# Patient Record
Sex: Female | Born: 2000 | Race: White | Hispanic: No | Smoking: Former smoker
Health system: Southern US, Community
[De-identification: ages and names within clinical notes are randomized; demographics above are authoritative.]

## PROBLEM LIST (undated history)

## (undated) ENCOUNTER — Emergency Department: Payer: Medicaid Other | Source: Home / Self Care

## (undated) DIAGNOSIS — J351 Hypertrophy of tonsils: Secondary | ICD-10-CM

## (undated) DIAGNOSIS — G4730 Sleep apnea, unspecified: Secondary | ICD-10-CM

## (undated) DIAGNOSIS — J45909 Unspecified asthma, uncomplicated: Secondary | ICD-10-CM

## (undated) HISTORY — PX: GUM SURGERY: SHX658

---
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
## 2021-01-02 LAB — OB RESULTS CONSOLE RUBELLA ANTIBODY, IGM: Rubella: IMMUNE

## 2021-01-02 LAB — OB RESULTS CONSOLE HEPATITIS B SURFACE ANTIGEN: Hepatitis B Surface Ag: NEGATIVE

## 2021-01-02 LAB — OB RESULTS CONSOLE VARICELLA ZOSTER ANTIBODY, IGG: Varicella: IMMUNE

## 2021-01-20 NOTE — L&D Delivery Note (Signed)
Delivery Note  Date of delivery: 07/12/2021 Estimated Date of Delivery: 07/09/21 Patient's last menstrual period was 10/03/2020 (approximate). EGA: [redacted]w[redacted]d  Delivery Note At 2:26 AM a viable female was delivered via Vaginal, Spontaneous (Presentation: LOA).  APGAR: 8, 9; weight  pending.  Placenta status: Spontaneous, Intact.  Cord: 3 vessels with the following complications: None.  Cord pH: n/a  First Stage: Labor onset: unknown Augmentation : Cytotec and Pitocin Analgesia /Anesthesia intrapartum: Epidural SROM at 2045  Tammie Meyer presented to L&D for an IOL for postdates. She was induced with cytotec and pitocin. Epidural placed for pain relief.   Second Stage: Complete dilation at 0126 Onset of pushing at 0155 FHR second stage Cat I / Cat II Delivery at 0226 on 07/12/2021  She progressed to complete and had a spontaneous vaginal birth of a live female over an intact perineum. The fetal head was delivered in OA position with restitution to LOA. No nuchal cord. Anterior then posterior shoulders delivered spontaneously. Baby placed on mom's abdomen and attended to by transition RN. Cord clamped and cut when pulseless by FOB.   Third Stage: Placenta delivered intact with 3VC at 0226 Placenta disposition: routine disposal Uterine tone firm / bleeding min IV pitocin given for hemorrhage prophylaxis  Anesthesia: Epidural Episiotomy: None Lacerations: 1st degree;Vaginal Suture Repair: 3.0 vicryl Est. Blood Loss (mL):  300  Complications: none  Mom to postpartum.  Baby to Couplet care / Skin to Skin.  Newborn: Birth Weight: pending  Apgar Scores: 8, 9 Feeding planned: Breastfeeding   Cyril Mourning, CNM 07/12/2021 2:50 AM

## 2021-02-19 ENCOUNTER — Observation Stay: Payer: Medicaid Other

## 2021-02-19 ENCOUNTER — Other Ambulatory Visit: Payer: Self-pay

## 2021-02-19 ENCOUNTER — Observation Stay: Payer: Medicaid Other | Admitting: Anesthesiology

## 2021-02-19 ENCOUNTER — Observation Stay
Admission: EM | Admit: 2021-02-19 | Discharge: 2021-02-20 | Disposition: A | Discharge status: Home / Self Care | Payer: Medicaid Other | Attending: Obstetrics and Gynecology | Admitting: Obstetrics and Gynecology

## 2021-02-19 ENCOUNTER — Encounter: Admission: EM | Disposition: A | Discharge status: Home / Self Care | Payer: Self-pay | Source: Home / Self Care

## 2021-02-19 ENCOUNTER — Encounter: Payer: Self-pay | Admitting: *Deleted

## 2021-02-19 DIAGNOSIS — F17210 Nicotine dependence, cigarettes, uncomplicated: Secondary | ICD-10-CM | POA: Insufficient documentation

## 2021-02-19 DIAGNOSIS — D270 Benign neoplasm of right ovary: Secondary | ICD-10-CM | POA: Diagnosis not present

## 2021-02-19 DIAGNOSIS — O3482 Maternal care for other abnormalities of pelvic organs, second trimester: Secondary | ICD-10-CM | POA: Diagnosis present

## 2021-02-19 DIAGNOSIS — R109 Unspecified abdominal pain: Secondary | ICD-10-CM

## 2021-02-19 DIAGNOSIS — O26892 Other specified pregnancy related conditions, second trimester: Secondary | ICD-10-CM

## 2021-02-19 DIAGNOSIS — O0992 Supervision of high risk pregnancy, unspecified, second trimester: Secondary | ICD-10-CM

## 2021-02-19 DIAGNOSIS — Z20822 Contact with and (suspected) exposure to covid-19: Secondary | ICD-10-CM | POA: Diagnosis not present

## 2021-02-19 DIAGNOSIS — O99332 Smoking (tobacco) complicating pregnancy, second trimester: Secondary | ICD-10-CM | POA: Diagnosis not present

## 2021-02-19 DIAGNOSIS — R1031 Right lower quadrant pain: Secondary | ICD-10-CM

## 2021-02-19 DIAGNOSIS — Z3A20 20 weeks gestation of pregnancy: Secondary | ICD-10-CM | POA: Diagnosis not present

## 2021-02-19 HISTORY — PX: LAPAROSCOPIC OVARIAN CYSTECTOMY: SHX6248

## 2021-02-19 LAB — CBC
HCT: 32.5 % — ABNORMAL LOW (ref 36.0–46.0)
Hemoglobin: 11.6 g/dL — ABNORMAL LOW (ref 12.0–15.0)
MCH: 31.6 pg (ref 26.0–34.0)
MCHC: 35.7 g/dL (ref 30.0–36.0)
MCV: 88.6 fL (ref 80.0–100.0)
Platelets: 229 10*3/uL (ref 150–400)
RBC: 3.67 MIL/uL — ABNORMAL LOW (ref 3.87–5.11)
RDW: 12.8 % (ref 11.5–15.5)
WBC: 11.3 10*3/uL — ABNORMAL HIGH (ref 4.0–10.5)
nRBC: 0 % (ref 0.0–0.2)

## 2021-02-19 LAB — URINALYSIS, ROUTINE W REFLEX MICROSCOPIC
Glucose, UA: NEGATIVE mg/dL
Hgb urine dipstick: NEGATIVE
Ketones, ur: NEGATIVE mg/dL
Leukocytes,Ua: NEGATIVE
Nitrite: NEGATIVE
Protein, ur: 30 mg/dL — AB
Specific Gravity, Urine: >1.03 — ABNORMAL HIGH (ref 1.005–1.030)
pH: 5.5 (ref 5.0–8.0)

## 2021-02-19 LAB — COMPREHENSIVE METABOLIC PANEL
ALT: 14 U/L (ref 0–44)
AST: 14 U/L — ABNORMAL LOW (ref 15–41)
Albumin: 3.1 g/dL — ABNORMAL LOW (ref 3.5–5.0)
Alkaline Phosphatase: 67 U/L (ref 38–126)
Anion gap: 7 (ref 5–15)
BUN: 7 mg/dL (ref 6–20)
CO2: 20 mmol/L — ABNORMAL LOW (ref 22–32)
Calcium: 8.6 mg/dL — ABNORMAL LOW (ref 8.9–10.3)
Chloride: 108 mmol/L (ref 98–111)
Creatinine, Ser: 0.49 mg/dL (ref 0.44–1.00)
GFR, Estimated: >60 mL/min (ref >60)
Glucose, Bld: 99 mg/dL (ref 70–99)
Potassium: 3.5 mmol/L (ref 3.5–5.1)
Sodium: 135 mmol/L (ref 135–145)
Total Bilirubin: 0.3 mg/dL (ref 0.3–1.2)
Total Protein: 6.5 g/dL (ref 6.5–8.1)

## 2021-02-19 LAB — RESP PANEL BY RT-PCR (FLU A&B, COVID) ARPGX2
Influenza A by PCR: NEGATIVE
Influenza B by PCR: NEGATIVE
SARS Coronavirus 2 by RT PCR: NEGATIVE

## 2021-02-19 LAB — URINALYSIS, MICROSCOPIC (REFLEX)
Bacteria, UA: NONE SEEN
RBC / HPF: NONE SEEN RBC/hpf (ref 0–5)

## 2021-02-19 LAB — PROTEIN / CREATININE RATIO, URINE
Creatinine, Urine: 389 mg/dL
Protein Creatinine Ratio: 0.06 mg/mg{Cre} (ref 0.00–0.15)
Total Protein, Urine: 25 mg/dL

## 2021-02-19 LAB — URINE DRUG SCREEN, QUALITATIVE (ARMC ONLY)
Amphetamines, Ur Screen: NOT DETECTED
Barbiturates, Ur Screen: NOT DETECTED
Benzodiazepine, Ur Scrn: NOT DETECTED
Cannabinoid 50 Ng, Ur ~~LOC~~: NOT DETECTED
Cocaine Metabolite,Ur ~~LOC~~: NOT DETECTED
MDMA (Ecstasy)Ur Screen: NOT DETECTED
Methadone Scn, Ur: NOT DETECTED
Opiate, Ur Screen: POSITIVE — AB
Phencyclidine (PCP) Ur S: NOT DETECTED
Tricyclic, Ur Screen: NOT DETECTED

## 2021-02-19 LAB — URIC ACID: Uric Acid, Serum: 3.4 mg/dL (ref 2.5–7.1)

## 2021-02-19 IMAGING — US US ART/VEN ABD/PELV/SCROTUM DOPPLER COMPLETE
1 series · 14 of 24 positions shown · non-contrast
Comparison: None.

CLINICAL DATA: Twenty weeks pregnant.  Right lower quadrant pain

EXAM:
DOPPLER ULTRASOUND OF OVARIES
TECHNIQUE: Color and duplex Doppler ultrasound was utilized to evaluate blood
flow to the right ovary.

[Series 1: us abdominal pelvic art/vent flow doppler · 24 acquisitions, 14 frames shown]
[im 1/24]
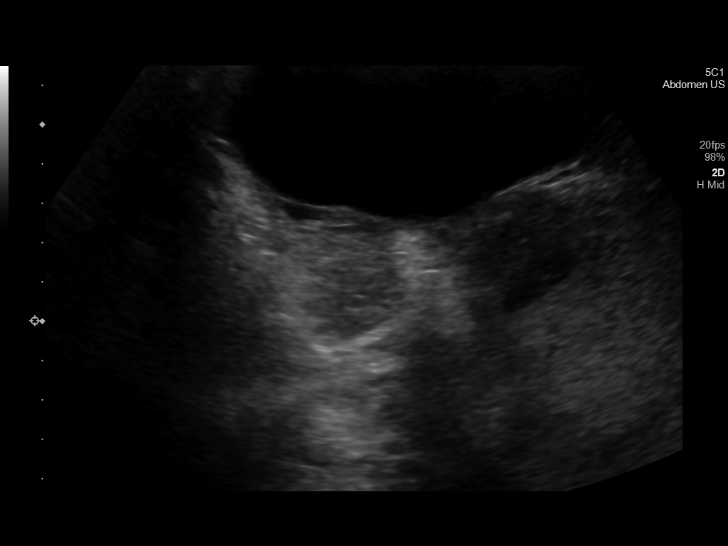
[im 3/24]
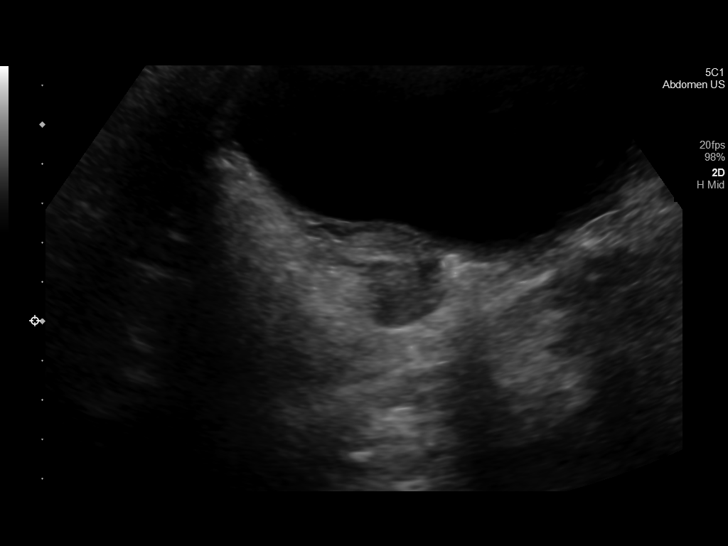
[im 5/24]
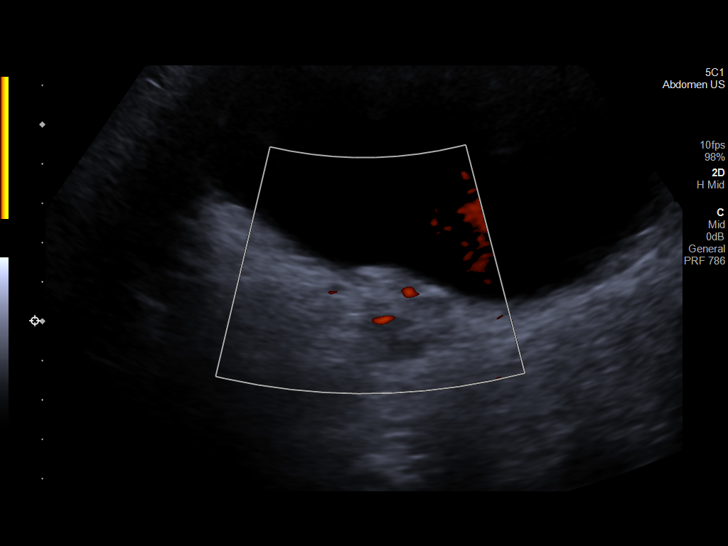
[im 7/24]
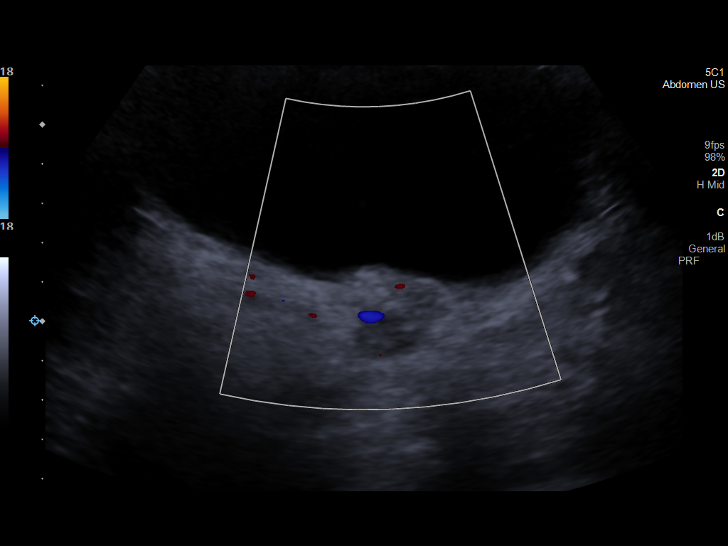
[im 8/24]
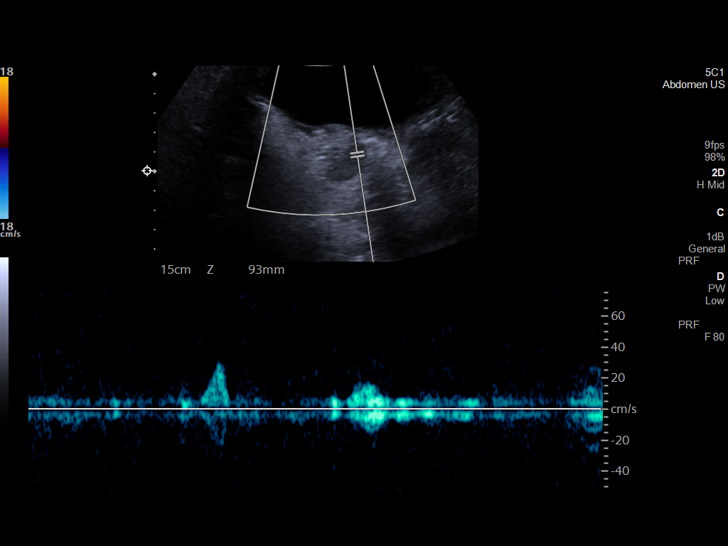
[im 10/24]
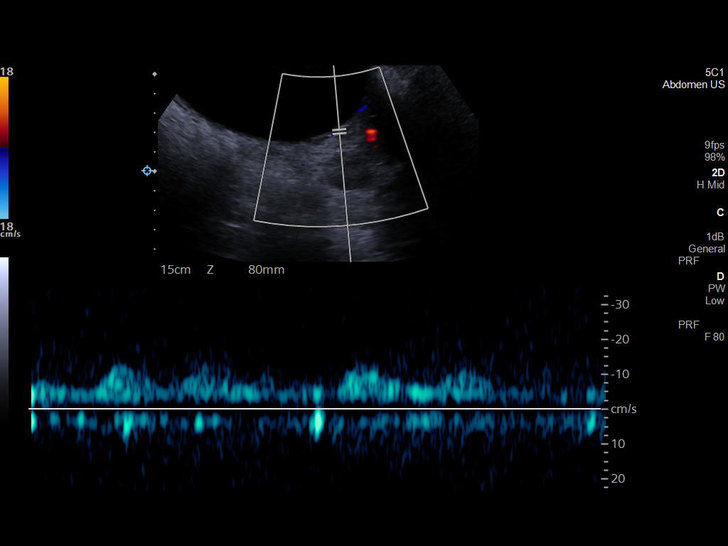
[im 12/24]
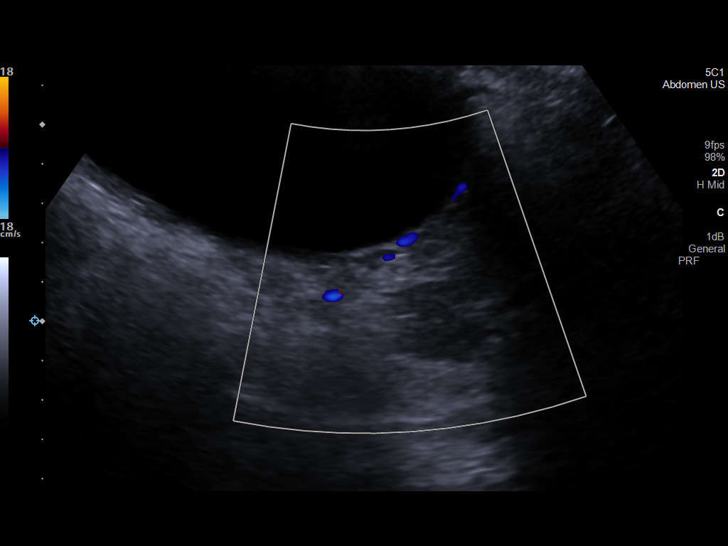
[im 13/24]
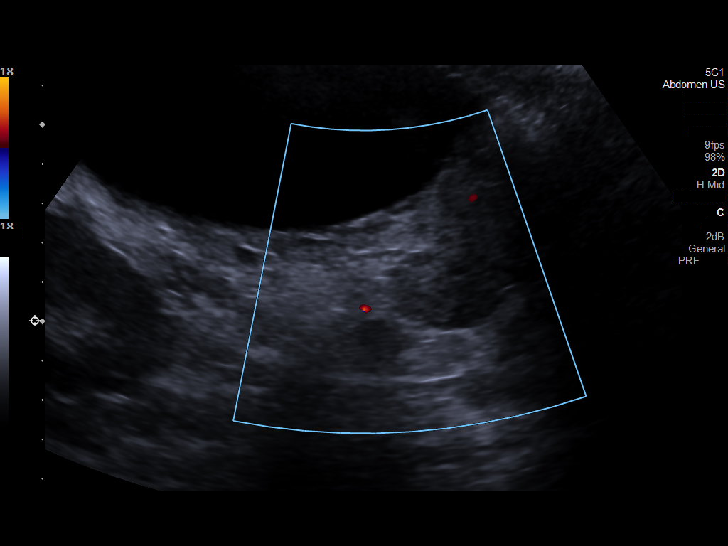
[im 15/24]
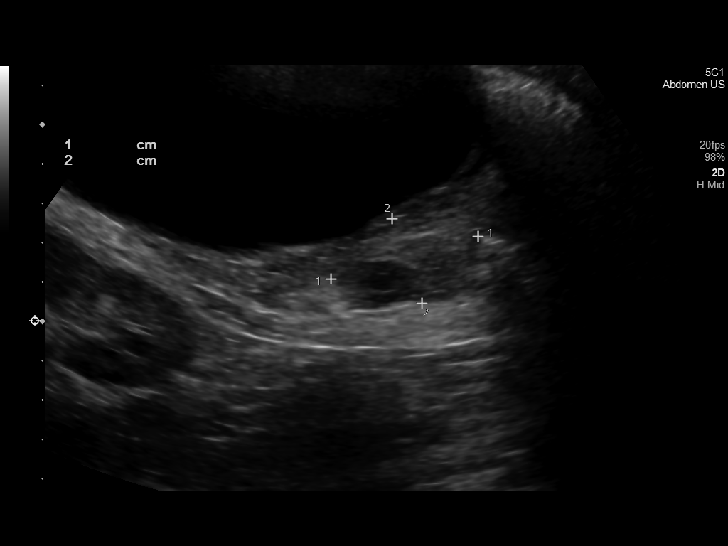
[im 17/24]
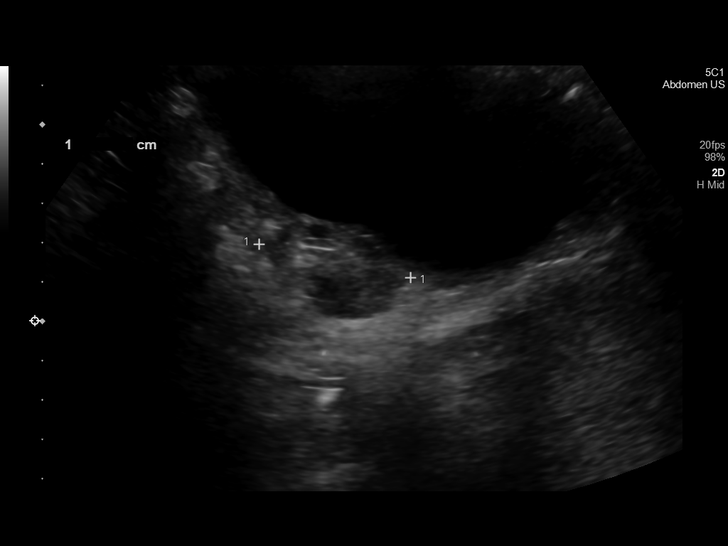
[im 19/24]
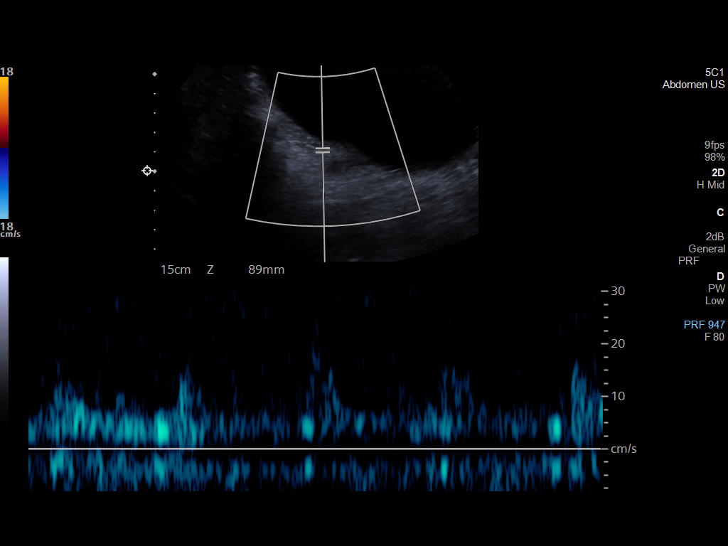
[im 20/24]
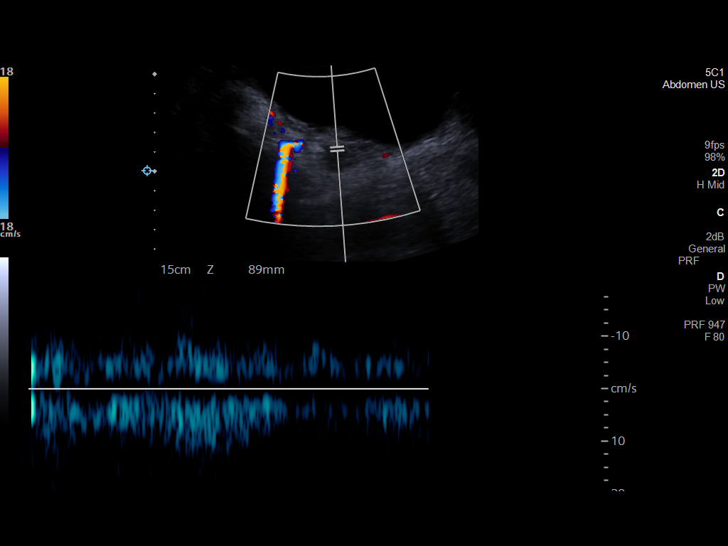
[im 22/24]
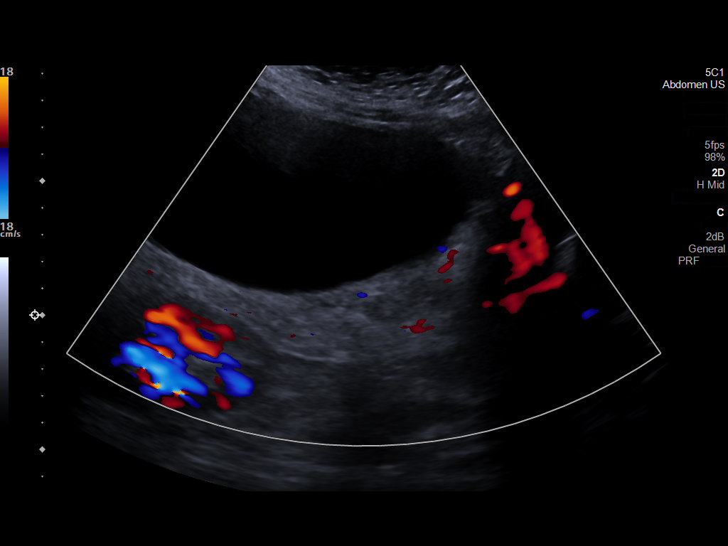
[im 24/24]
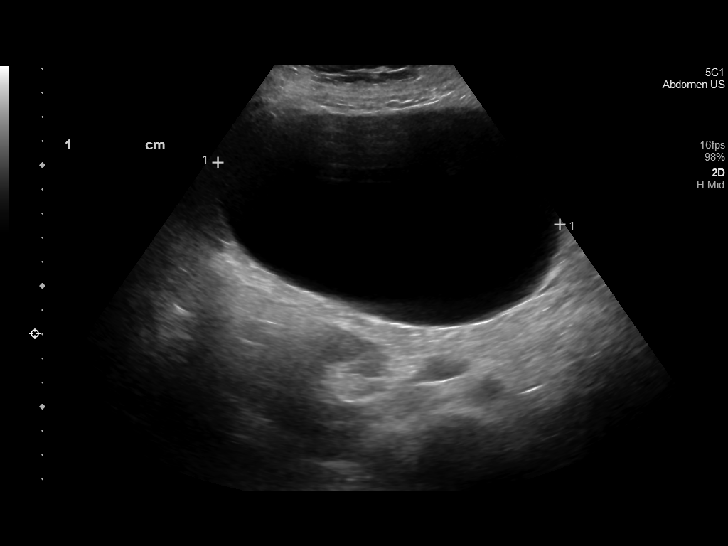

[14 of 24 positions shown; findings below may reference images not displayed]

FINDINGS: Right ovary

Measurements: 3.9 x 2.3 x 3.9 cm = volume: 18.3 mL. 13 x 5 x 9.9 x
14.4 cm anechoic right ovarian mass. Normal arterial and venous
Doppler waveforms are demonstrated in the right ovary.
IMPRESSION: 1. No right ovarian torsion.
2. A 13.5 x 9.9 x 14.4 cm right ovarian cyst. If there is further
clinical concern regarding right lower quadrant pain for the right
ovarian cyst, recommend a MRI of the abdomen and pelvis without
contrast.

## 2021-02-19 IMAGING — US US RENAL
1 series · 14 of 25 positions shown · non-contrast
Comparison: None.

CLINICAL DATA: Acute right flank and groin pain. Proximally 20
weeks gestational age.

EXAM:
RENAL / URINARY TRACT ULTRASOUND COMPLETE

[Series 1: us renal · 36 acquisitions, 14 frames shown]
[im 1/36]
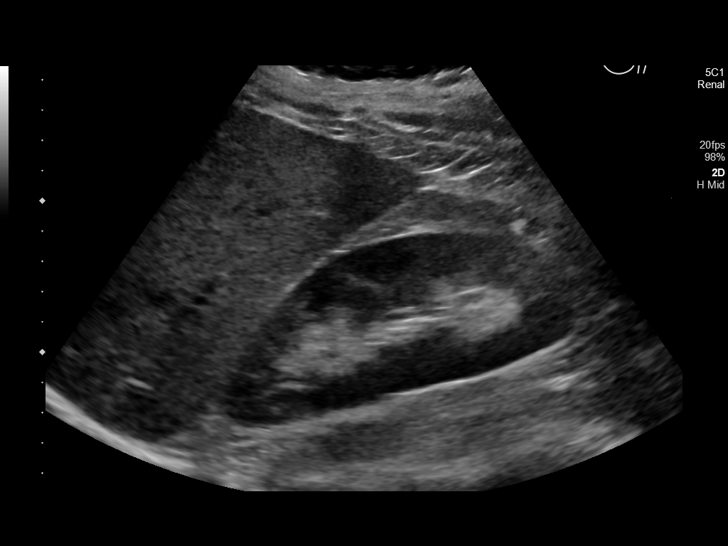
[im 3/36]
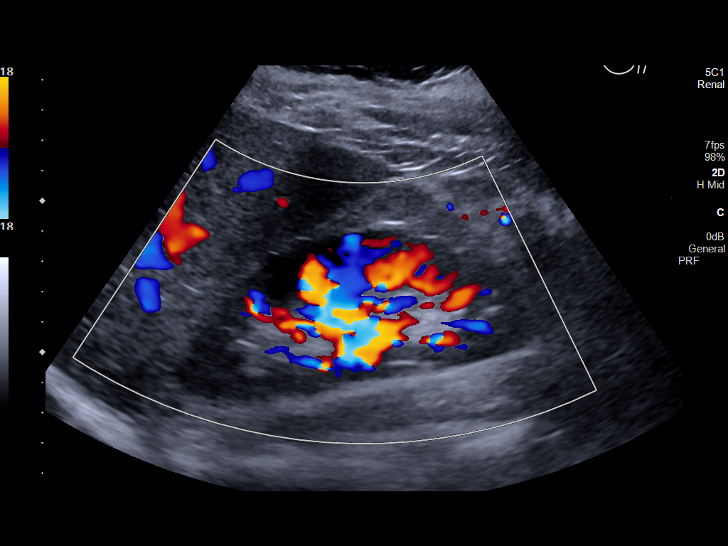
[im 6/36]
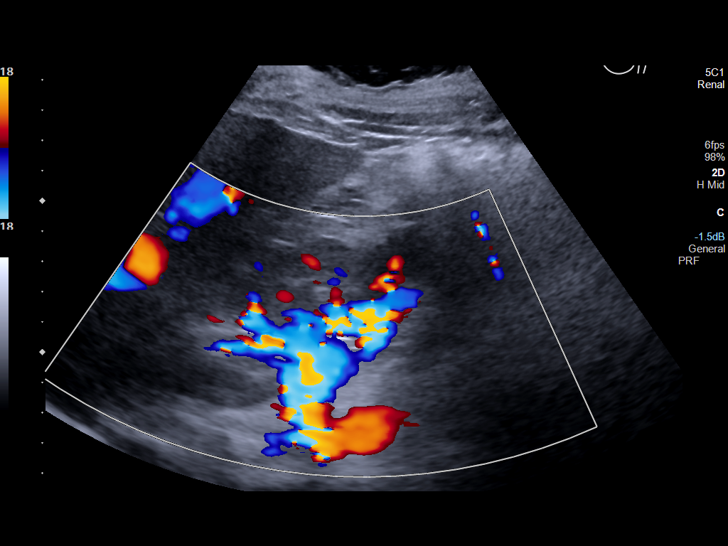
[im 9/36]
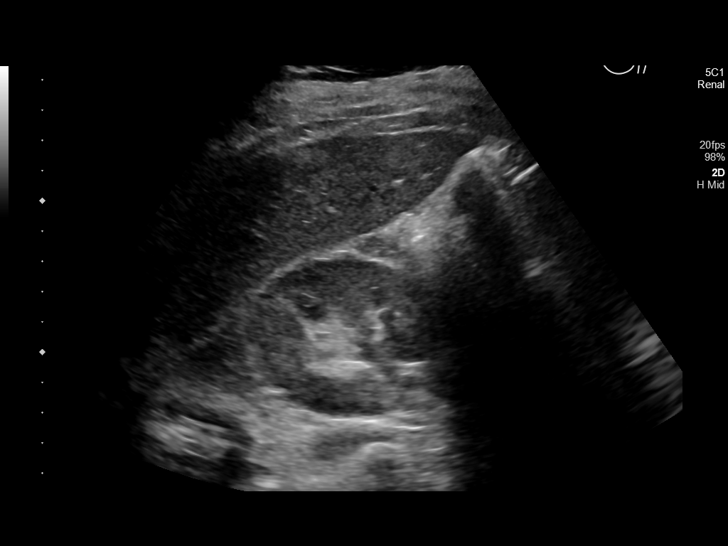
[im 12/36]
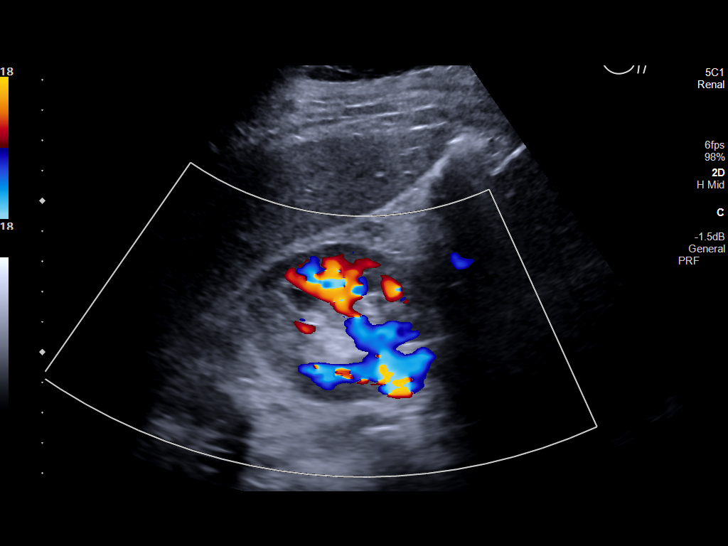
[im 14/36]
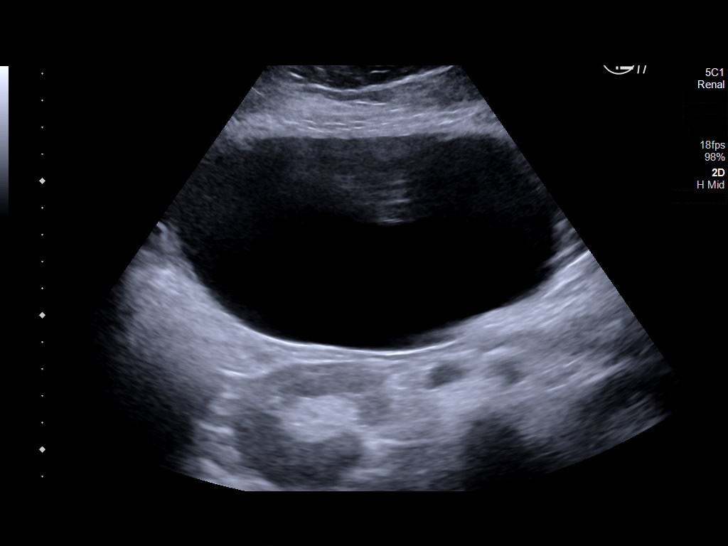
[im 17/36]
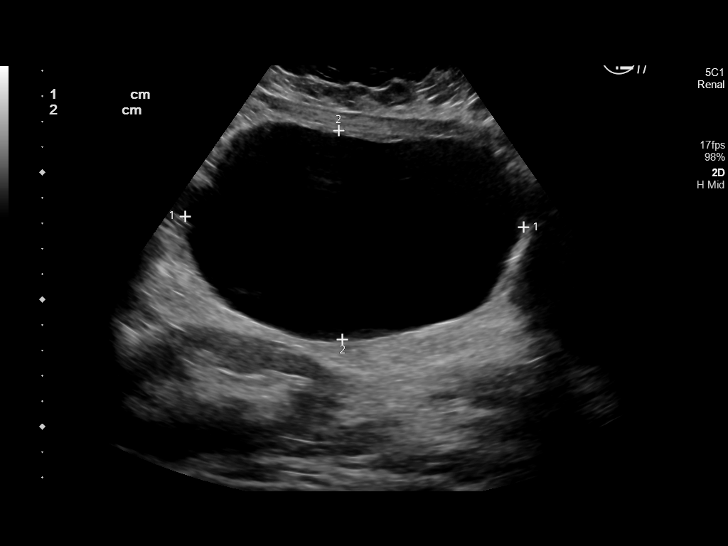
[im 19/36]
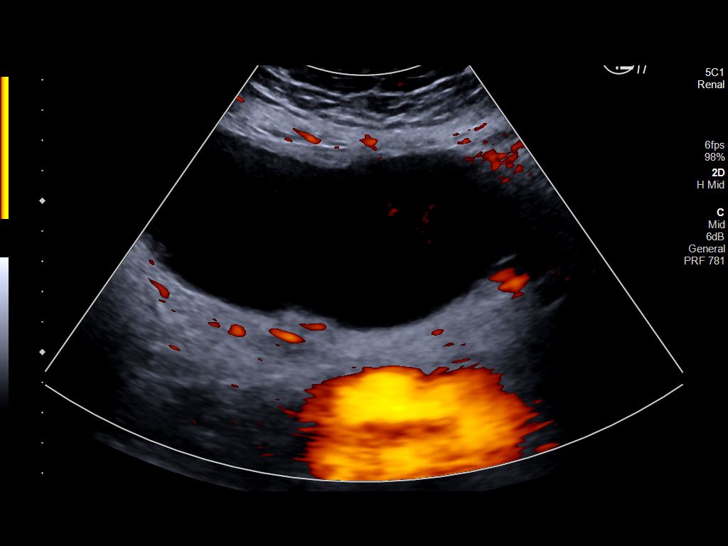
[im 22/36]
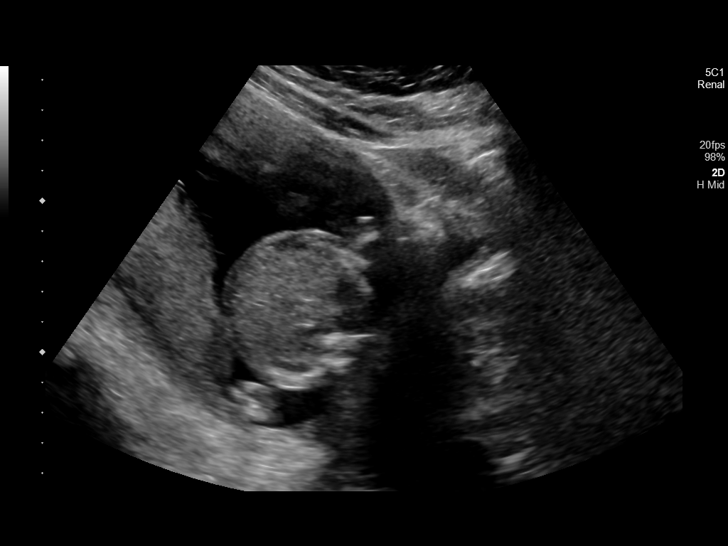
[im 24/36]
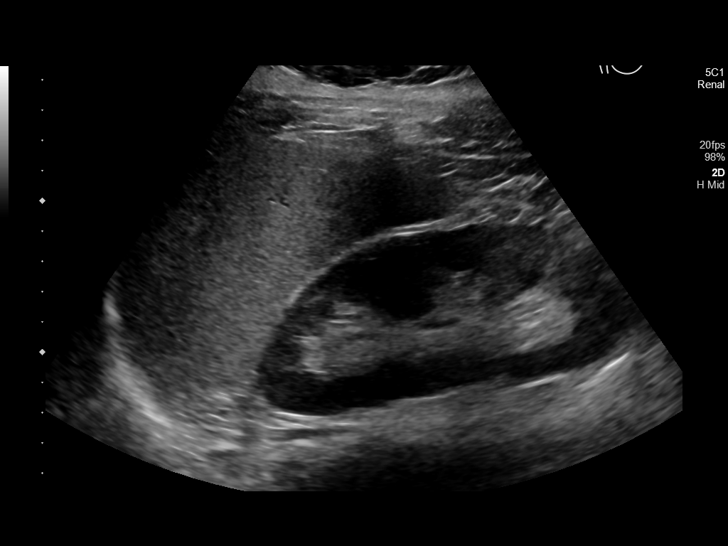
[im 27/36]
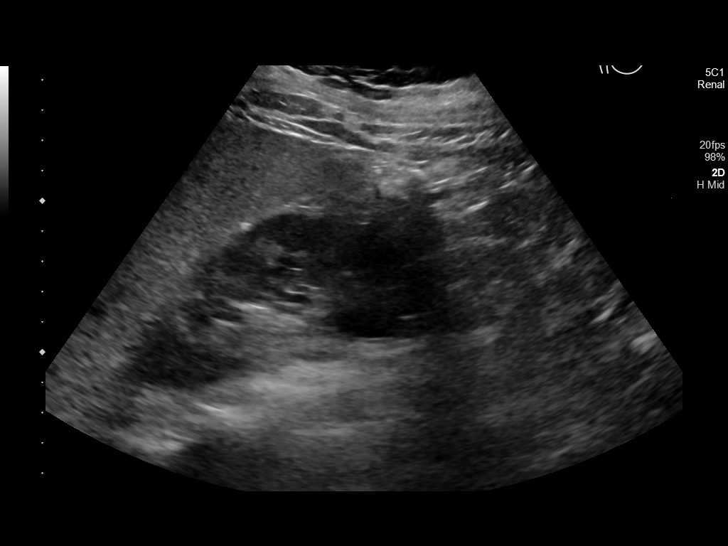
[im 30/36]
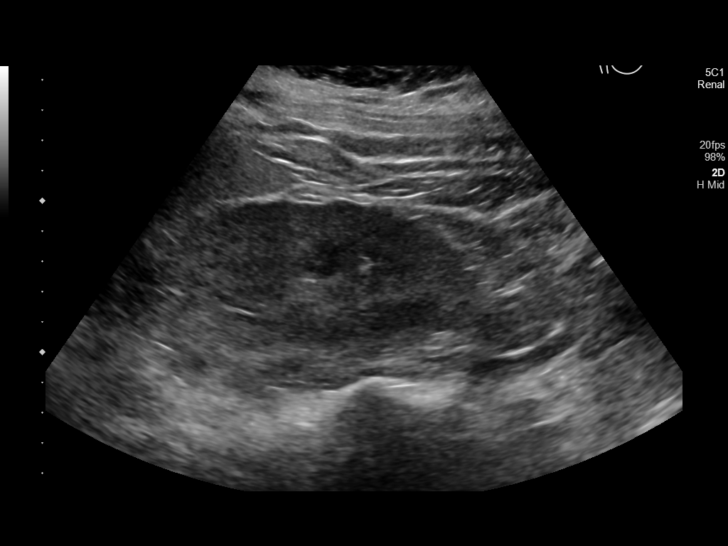
[im 33/36]
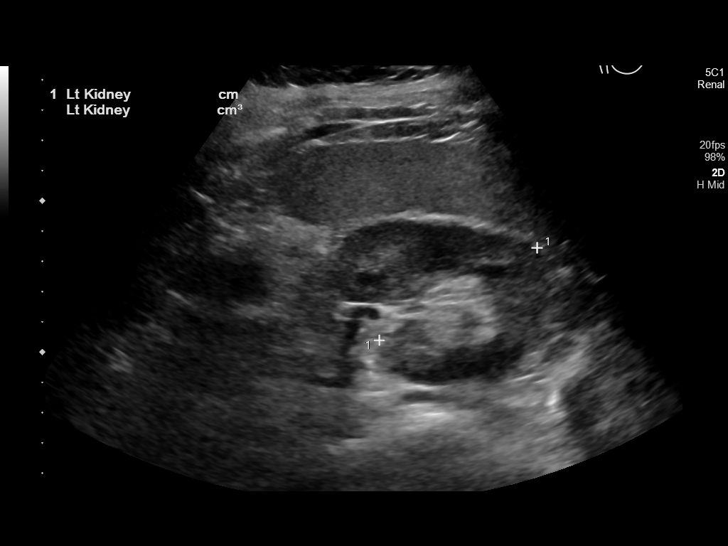
[im 36/36]
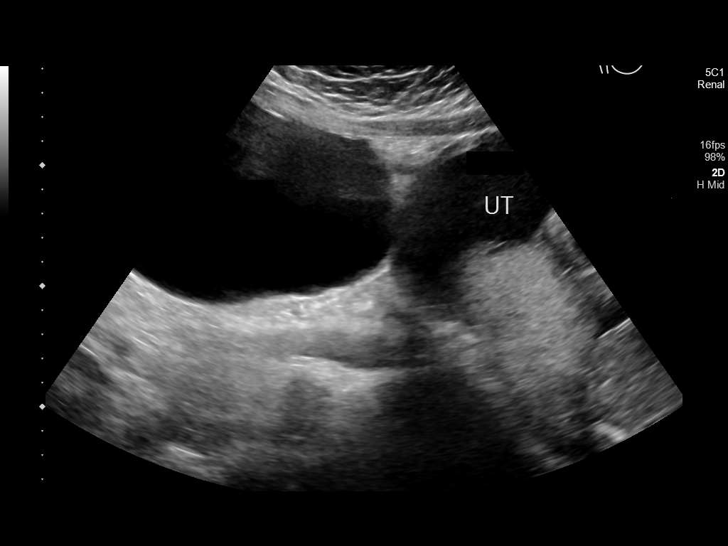

[14 of 25 positions shown; findings below may reference images not displayed]

FINDINGS: Right Kidney:

Renal measurements: 12.7 x 5.0 x 5.7 cm = volume: 187 mL.
Echogenicity within normal limits. No mass or hydronephrosis
visualized.

Left Kidney:

Renal measurements: 12.6 x 5.1 x 6.0 cm = volume: 203 mL.
Echogenicity within normal limits. No mass or hydronephrosis
visualized.

Bladder:

Nondistended.

Other:

13.3 x 8.2 x 15.3 cm simple appearing cyst is identified in the low
abdomen and upper pelvis in a right paramidline location superior to
the uterus.
IMPRESSION: 1. No evidence for hydronephrosis in either kidney.
2. Large simple appearing cyst in the midline/right paramidline
upper pelvis/lower abdomen measuring 13.3 x 8.2 x 15.3 cm.
[DATE] clinical note from [REDACTED] on [REDACTED] [REDACTED] documented a "Left (midline) simple ovarian cyst=14.50 x
8.42 x 15.21cm".
3. No evidence for intraperitoneal free fluid.

## 2021-02-19 IMAGING — MR MR PELVIS W/O CM
8 of 11 series · 32 of 48 positions shown · non-contrast
Comparison: Pelvic ultrasound [DATE]

CLINICAL DATA: Right lower quadrant pain with pregnancy

EXAM:
MRI ABDOMEN AND PELVIS WITHOUT CONTRAST
TECHNIQUE: Multiplanar multisequence MR imaging of the abdomen and pelvis was
performed. No intravenous contrast was administered.

[Series 4: cor haste · coronal · 5.0mm · 1.25mm/px · 3 of 44 slices shown]
[im 1/44]
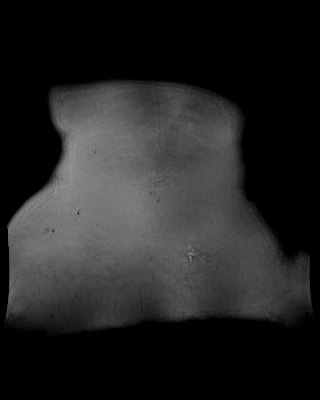
[im 22/44]
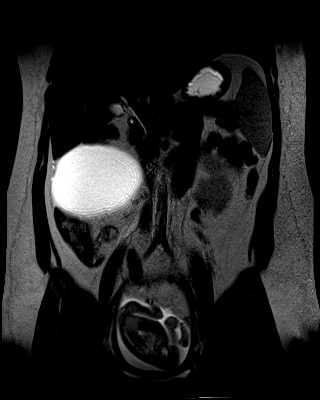
[im 44/44]
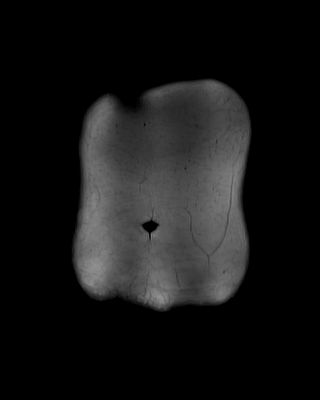

[Series 5: T2 · axial · 4.0mm · 1.00mm/px · z∈[-219,+51]mm · 3 of 55 slices shown (1 of 2)]
[im 1/55]
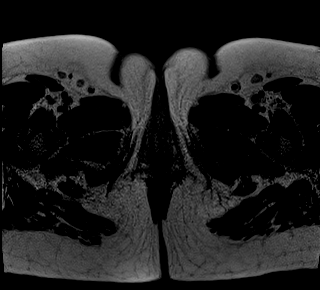
[im 28/55]
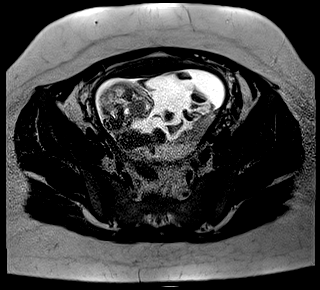
[im 55/55]
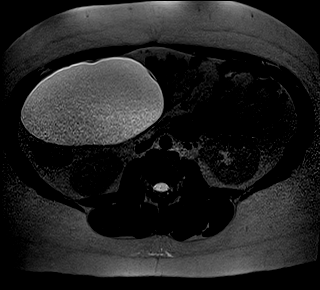

[Series 6: T2 fat-sat · axial · 4.0mm · 1.00mm/px · z∈[-219,+51]mm · 4 of 55 slices shown]
[im 1/55]
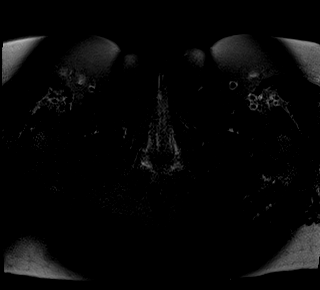
[im 19/55]
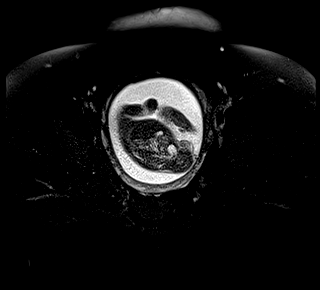
[im 37/55]
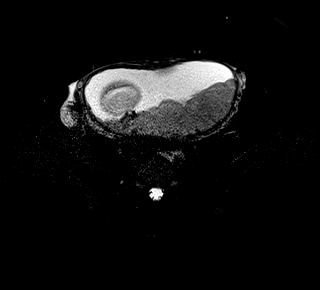
[im 55/55]
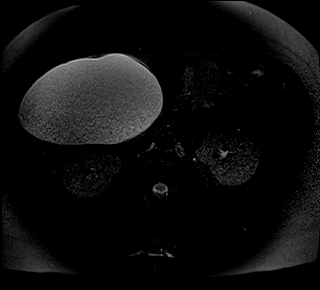

[Series 7: bSSFP · axial · 4.0mm · 0.62mm/px · z∈[-219,+51]mm · 4 of 55 slices shown (1 of 2)]
[im 1/55]
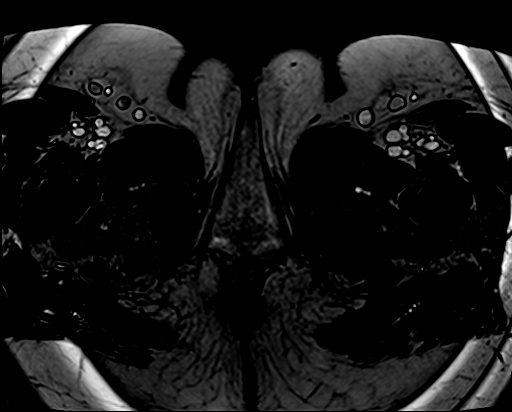
[im 19/55]
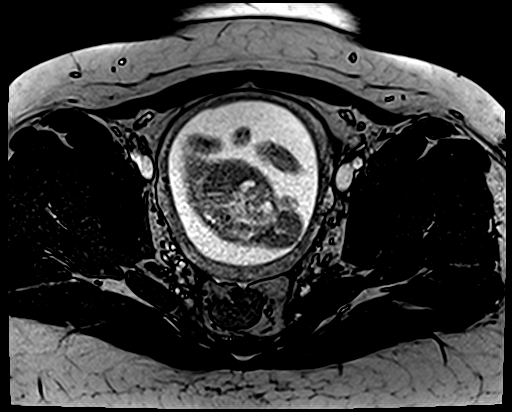
[im 37/55]
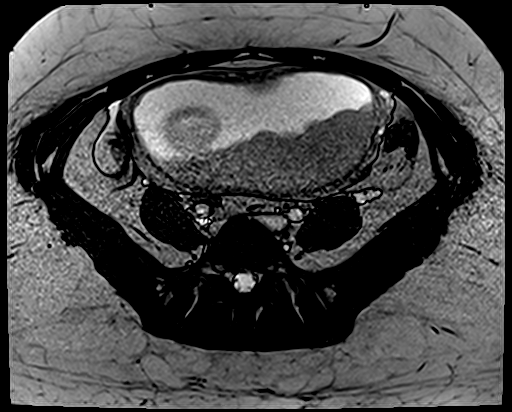
[im 55/55]
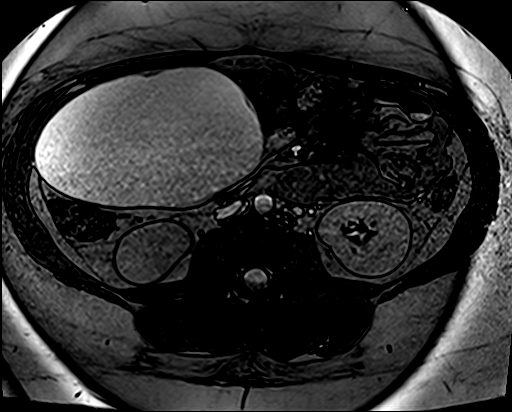

[Series 8: T1 dynamic fat-sat · axial · 3.0mm · 1.00mm/px · z∈[-212,+233]mm · 8 of 160 slices shown]
[im 1/160]
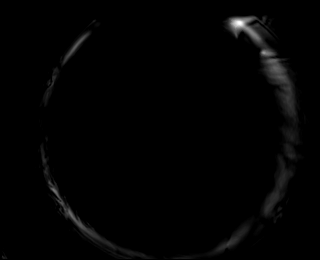
[im 27/160]
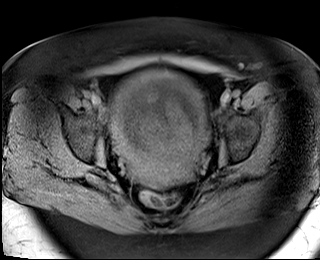
[im 54/160]
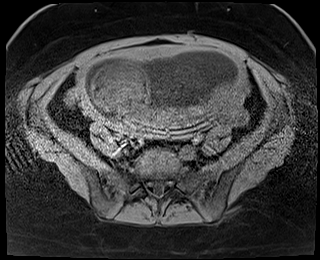
[im 67/160]
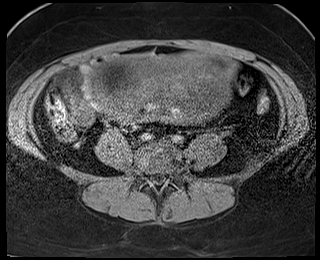
[im 93/160]
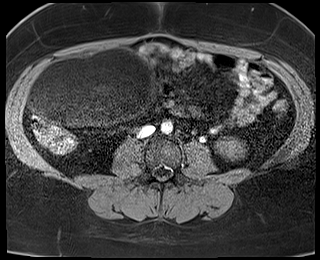
[im 107/160]
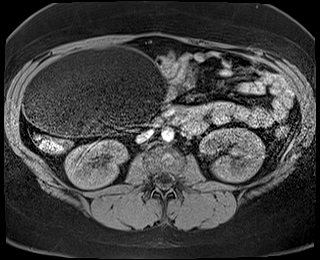
[im 133/160]
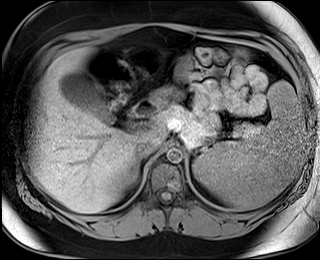
[im 160/160]
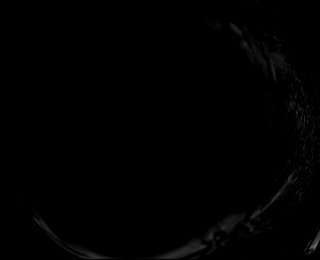

[Series 9: cor haste fs · coronal · 5.0mm · 1.25mm/px · 4 of 44 slices shown]
[im 1/44]
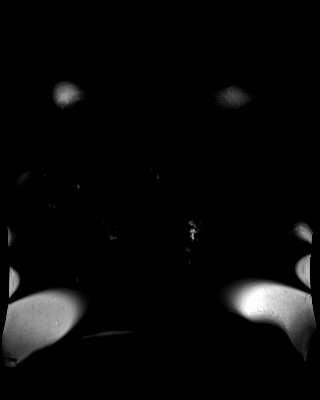
[im 15/44]
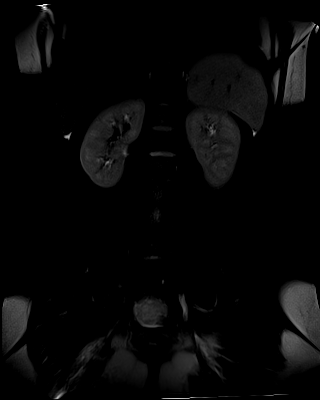
[im 29/44]
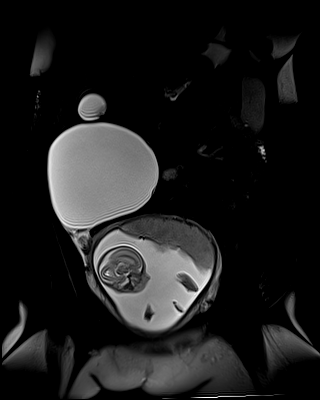
[im 44/44]
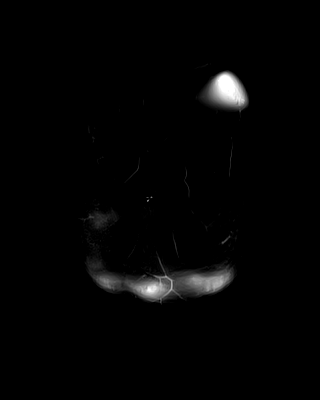

[Series 10: bSSFP · coronal · 5.0mm · 0.62mm/px · 4 of 44 slices shown (2 of 2)]
[im 1/44]
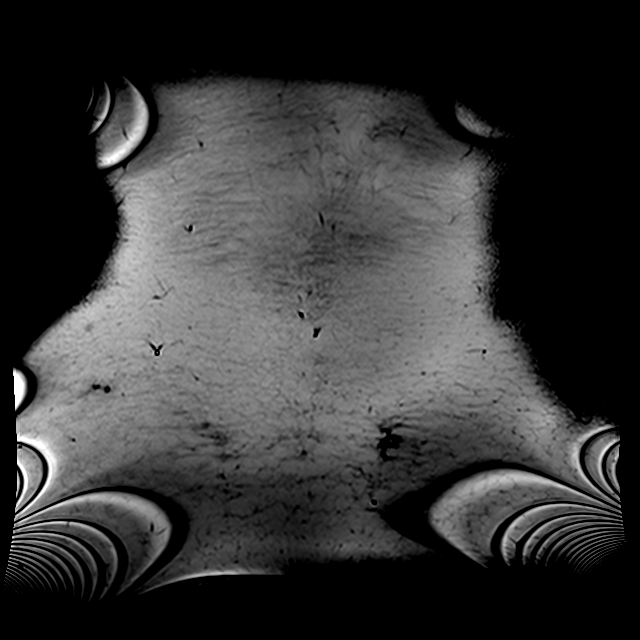
[im 15/44]
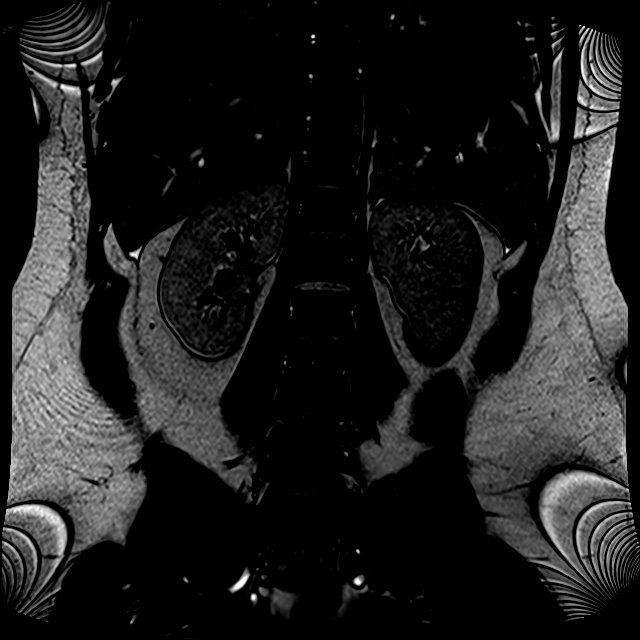
[im 29/44]
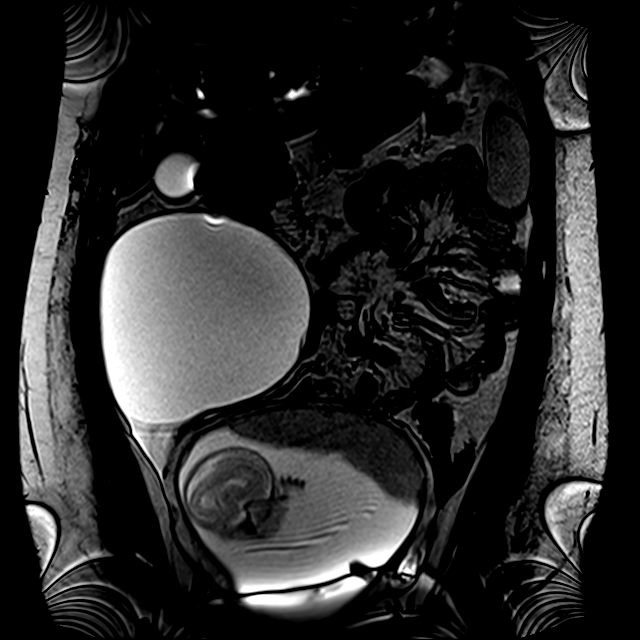
[im 44/44]
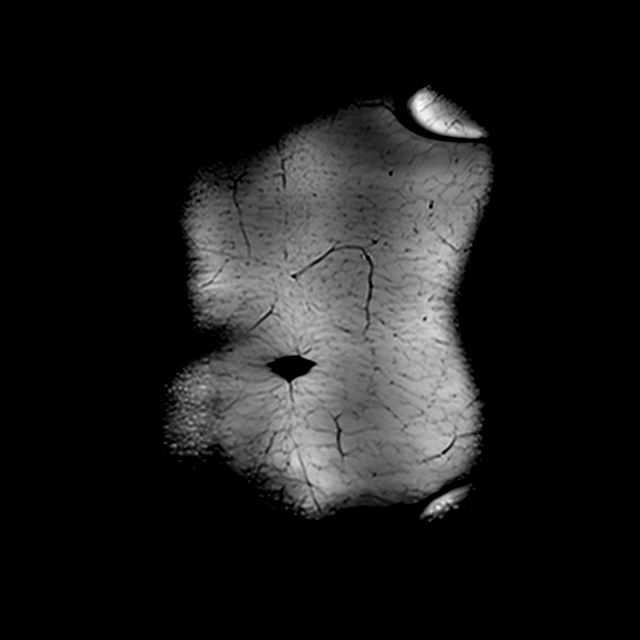

[Series 11: T2 · axial · 5.0mm · 1.00mm/px · z∈[+64,+154]mm · 2 of 32 slices shown (2 of 2)]
[im 1/32]
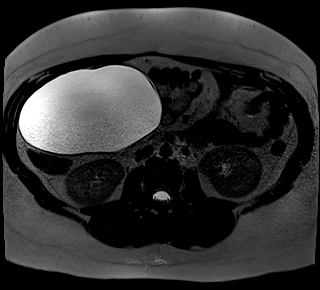
[im 16/32]
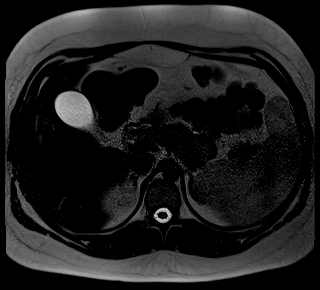

[32 of 48 positions shown; findings below may reference images not displayed]

FINDINGS: Study is somewhat limited due to motion.

COMBINED FINDINGS FOR BOTH MR ABDOMEN AND PELVIS

Lower chest: Unremarkable.

Hepatobiliary: Liver is normal in size and contour with no
suspicious mass identified. Gallbladder appears normal. No biliary
ductal dilatation identified.

Pancreas: No mass, inflammatory changes, or other parenchymal
abnormality identified.

Spleen:  Enlarged measuring 14.7 cm in length.

Adrenals/Urinary Tract: Adrenal glands appear normal. No
hydronephrosis or suspicious renal mass identified.

Stomach/Bowel: No evidence of bowel obstruction. Appendix appears
normal.

Vascular/Lymphatic: No bulky lymphadenopathy visualized. No
abdominal aortic aneurysm.

Reproductive: Gravid uterus. Large simple appearing right
adnexal/ovarian cyst measuring 8.5 x 14.3 x 13.3 cm in AP,
transverse and craniocaudal dimensions. Prominent tubular structure
in the right adnexa measuring proximally 2.3 cm in diameter and
cm in length.

Other:  Small amount of free fluid in the right adnexa.

Musculoskeletal: Suspicious bony lesions identified.
IMPRESSION: 1. Large simple appearing right adnexal/ovarian cyst.
2. Prominent tubular structure in the right adnexa, possible
distended fallopian tube versus loop of bowel.
3. Appendix appears normal.
4. Small amount of free fluid in the right adnexa.
5. Gravid uterus.
6. Splenomegaly.

## 2021-02-19 IMAGING — MR MR ABDOMEN W/O CM
8 of 11 series · 32 of 48 positions shown · non-contrast
Comparison: Pelvic ultrasound [DATE]

CLINICAL DATA: Right lower quadrant pain with pregnancy

EXAM:
MRI ABDOMEN AND PELVIS WITHOUT CONTRAST
TECHNIQUE: Multiplanar multisequence MR imaging of the abdomen and pelvis was
performed. No intravenous contrast was administered.

[Series 4: cor haste · coronal · 5.0mm · 1.25mm/px · 3 of 44 slices shown]
[im 1/44]
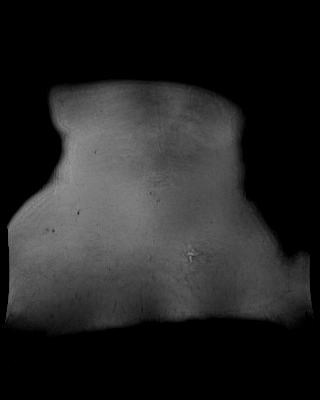
[im 22/44]
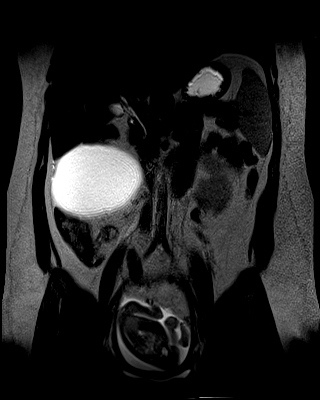
[im 44/44]
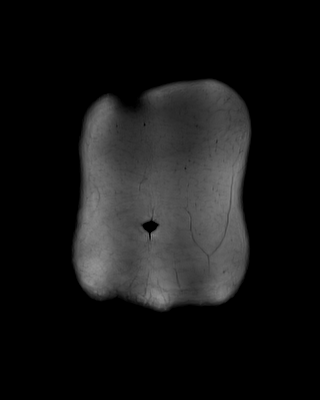

[Series 5: T2 · axial · 4.0mm · 1.00mm/px · z∈[-219,+51]mm · 3 of 55 slices shown (1 of 2)]
[im 1/55]
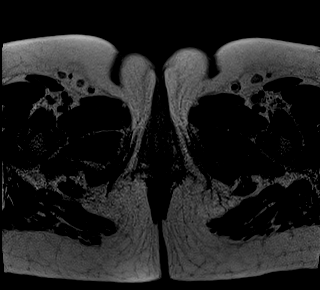
[im 28/55]
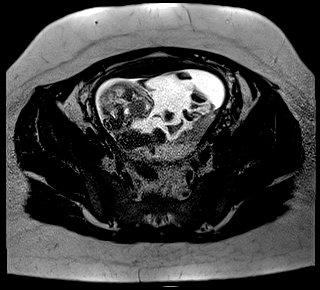
[im 55/55]
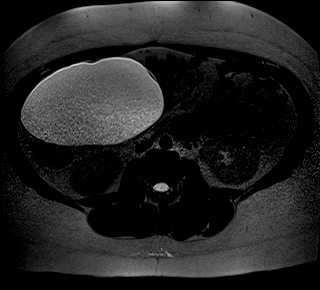

[Series 6: T2 fat-sat · axial · 4.0mm · 1.00mm/px · z∈[-219,+51]mm · 4 of 55 slices shown]
[im 1/55]
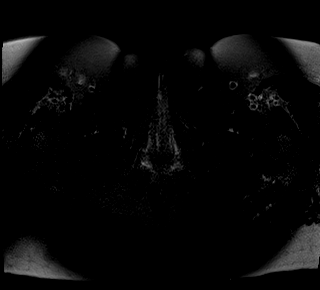
[im 19/55]
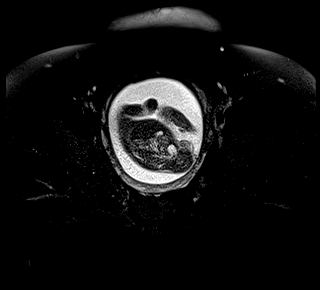
[im 37/55]
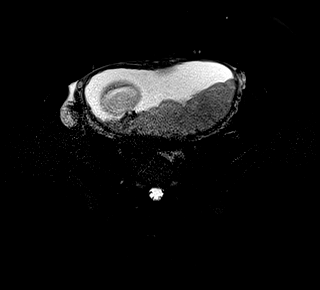
[im 55/55]
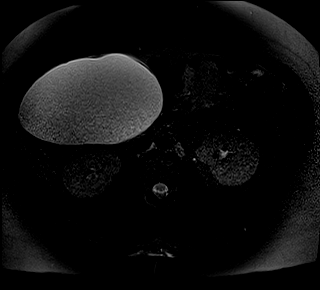

[Series 7: bSSFP · axial · 4.0mm · 0.62mm/px · z∈[-219,+51]mm · 4 of 55 slices shown (1 of 2)]
[im 1/55]
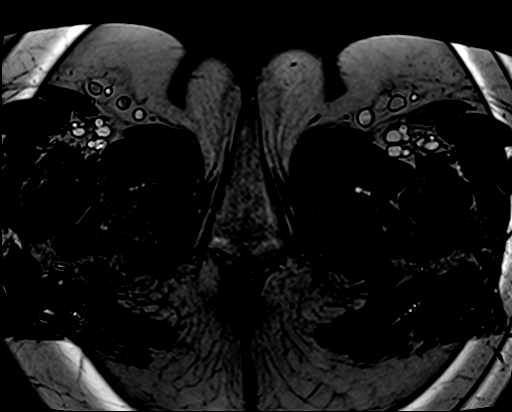
[im 19/55]
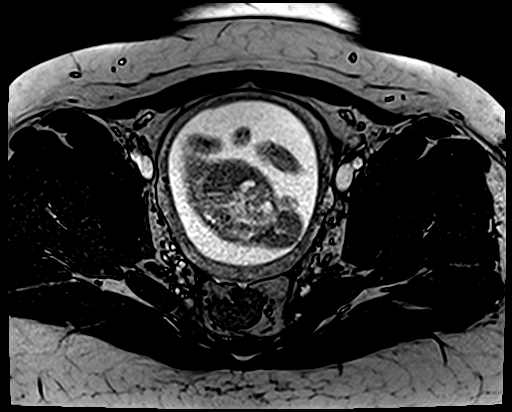
[im 37/55]
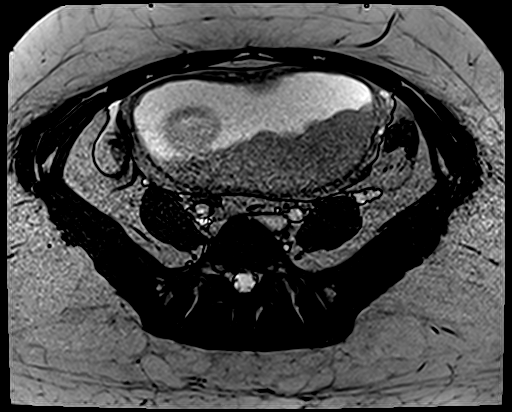
[im 55/55]
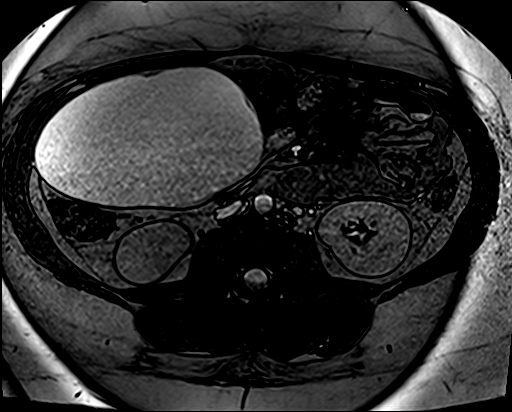

[Series 8: T1 dynamic fat-sat · axial · 3.0mm · 1.00mm/px · z∈[-212,+233]mm · 8 of 160 slices shown]
[im 1/160]
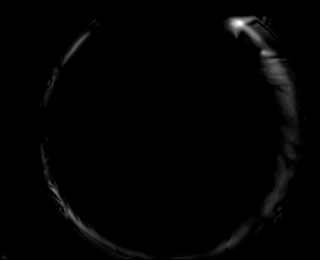
[im 27/160]
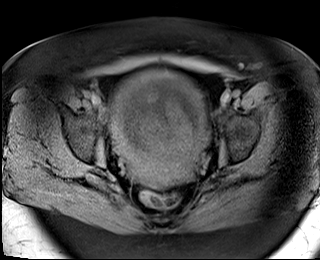
[im 54/160]
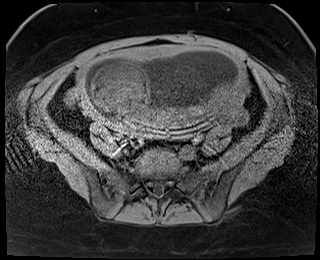
[im 67/160]
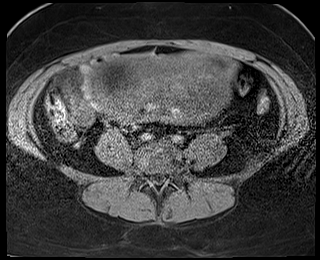
[im 93/160]
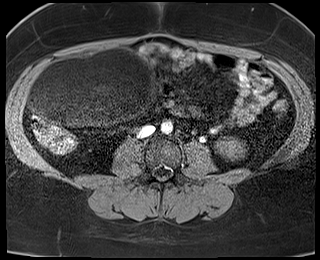
[im 107/160]
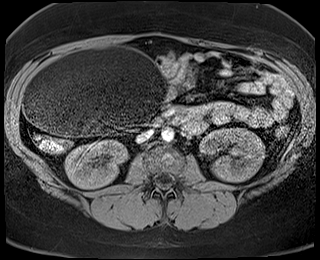
[im 133/160]
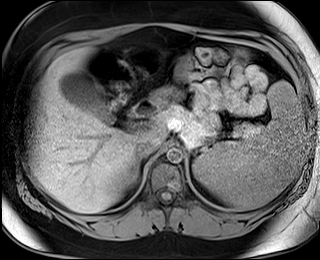
[im 160/160]
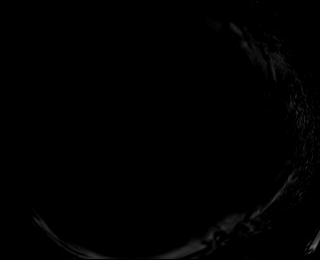

[Series 9: cor haste fs · coronal · 5.0mm · 1.25mm/px · 4 of 44 slices shown]
[im 1/44]
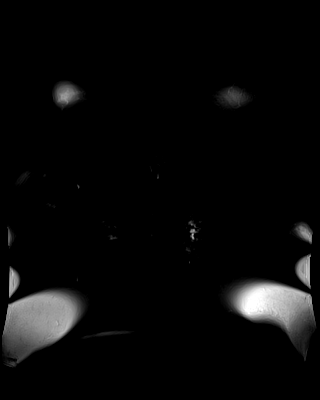
[im 15/44]
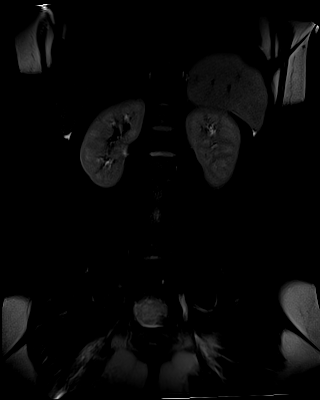
[im 29/44]
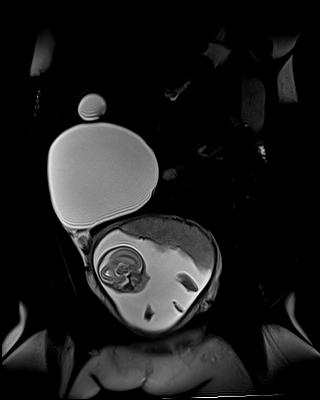
[im 44/44]
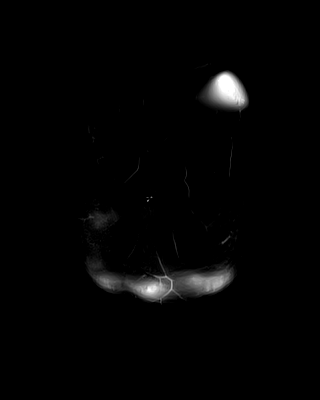

[Series 10: bSSFP · coronal · 5.0mm · 0.62mm/px · 4 of 44 slices shown (2 of 2)]
[im 1/44]
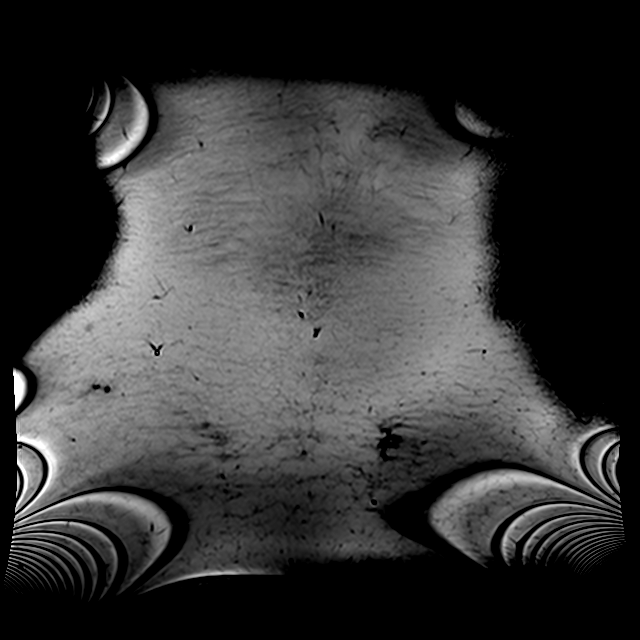
[im 15/44]
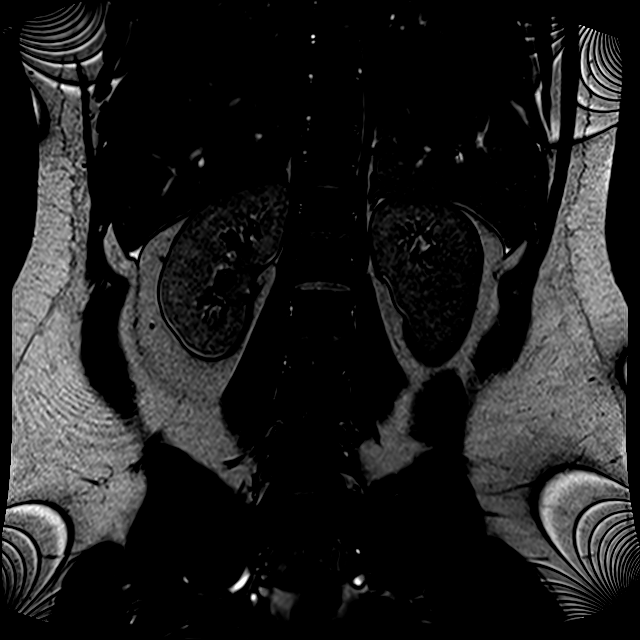
[im 29/44]
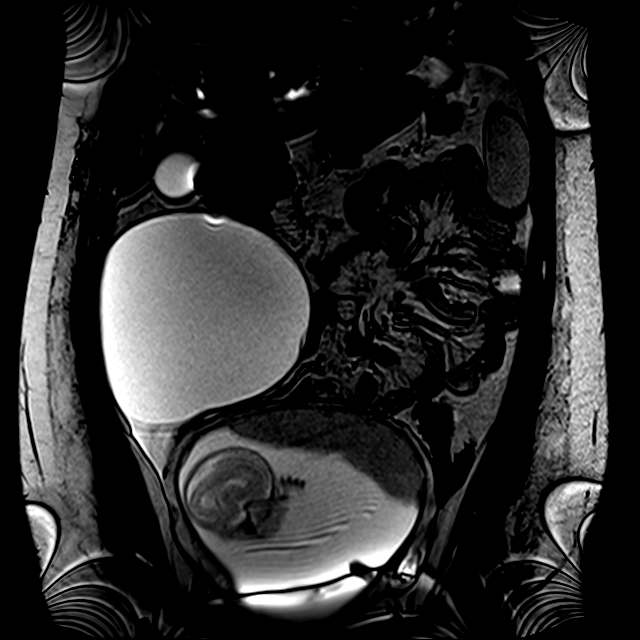
[im 44/44]
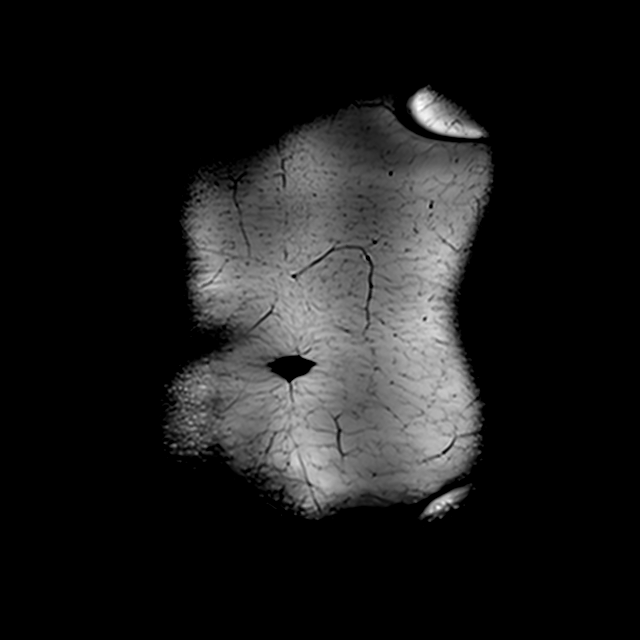

[Series 11: T2 · axial · 5.0mm · 1.00mm/px · z∈[+64,+154]mm · 2 of 32 slices shown (2 of 2)]
[im 1/32]
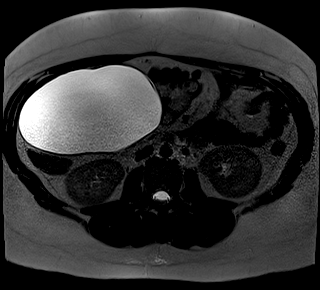
[im 16/32]
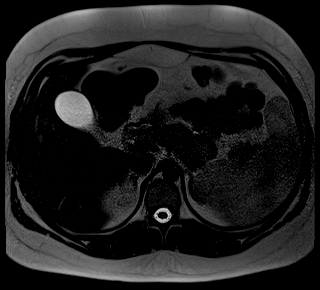

[32 of 48 positions shown; findings below may reference images not displayed]

FINDINGS: Study is somewhat limited due to motion.

COMBINED FINDINGS FOR BOTH MR ABDOMEN AND PELVIS

Lower chest: Unremarkable.

Hepatobiliary: Liver is normal in size and contour with no
suspicious mass identified. Gallbladder appears normal. No biliary
ductal dilatation identified.

Pancreas: No mass, inflammatory changes, or other parenchymal
abnormality identified.

Spleen:  Enlarged measuring 14.7 cm in length.

Adrenals/Urinary Tract: Adrenal glands appear normal. No
hydronephrosis or suspicious renal mass identified.

Stomach/Bowel: No evidence of bowel obstruction. Appendix appears
normal.

Vascular/Lymphatic: No bulky lymphadenopathy visualized. No
abdominal aortic aneurysm.

Reproductive: Gravid uterus. Large simple appearing right
adnexal/ovarian cyst measuring 8.5 x 14.3 x 13.3 cm in AP,
transverse and craniocaudal dimensions. Prominent tubular structure
in the right adnexa measuring proximally 2.3 cm in diameter and
cm in length.

Other:  Small amount of free fluid in the right adnexa.

Musculoskeletal: Suspicious bony lesions identified.
IMPRESSION: 1. Large simple appearing right adnexal/ovarian cyst.
2. Prominent tubular structure in the right adnexa, possible
distended fallopian tube versus loop of bowel.
3. Appendix appears normal.
4. Small amount of free fluid in the right adnexa.
5. Gravid uterus.
6. Splenomegaly.

## 2021-02-19 SURGERY — EXCISION, CYST, OVARY, LAPAROSCOPIC
Anesthesia: General | Site: Pelvis | Laterality: Right

## 2021-02-19 MED ORDER — ROCURONIUM BROMIDE 100 MG/10ML IV SOLN
INTRAVENOUS | Status: DC | PRN
  Administered 2021-02-19: 40 mg via INTRAVENOUS
  Administered 2021-02-19 (×2): 10 mg via INTRAVENOUS

## 2021-02-19 MED ORDER — ACETAMINOPHEN 500 MG PO TABS
1000.0000 mg | ORAL_TABLET | ORAL | Status: DC | PRN
  Administered 2021-02-19 – 2021-02-20 (×2): 1000 mg via ORAL
  Filled 2021-02-19: qty 2

## 2021-02-19 MED ORDER — PROPOFOL 500 MG/50ML IV EMUL
INTRAVENOUS | Status: DC | PRN
  Administered 2021-02-19: 25 ug/kg/min via INTRAVENOUS

## 2021-02-19 MED ORDER — SUCCINYLCHOLINE CHLORIDE 200 MG/10ML IV SOSY
PREFILLED_SYRINGE | INTRAVENOUS | Status: DC | PRN
  Administered 2021-02-19: 120 mg via INTRAVENOUS

## 2021-02-19 MED ORDER — HYDROMORPHONE HCL 1 MG/ML IJ SOLN
INTRAMUSCULAR | Status: AC
  Filled 2021-02-19: qty 1

## 2021-02-19 MED ORDER — FENTANYL CITRATE (PF) 100 MCG/2ML IJ SOLN
INTRAMUSCULAR | Status: AC
  Filled 2021-02-19: qty 2

## 2021-02-19 MED ORDER — LIDOCAINE HCL (PF) 2 % IJ SOLN
INTRAMUSCULAR | Status: AC
  Filled 2021-02-19: qty 5

## 2021-02-19 MED ORDER — CYCLOBENZAPRINE HCL 5 MG PO TABS
5.0000 mg | ORAL_TABLET | Freq: Three times a day (TID) | ORAL | Status: DC
  Administered 2021-02-20 (×2): 5 mg via ORAL
  Filled 2021-02-19 (×6): qty 1

## 2021-02-19 MED ORDER — BUPIVACAINE HCL (PF) 0.5 % IJ SOLN
INTRAMUSCULAR | Status: AC
  Filled 2021-02-19: qty 30

## 2021-02-19 MED ORDER — PRENATAL MULTIVITAMIN CH
1.0000 | ORAL_TABLET | Freq: Every day | ORAL | Status: DC
  Administered 2021-02-20: 1 via ORAL
  Filled 2021-02-19: qty 1

## 2021-02-19 MED ORDER — LACTATED RINGERS IV SOLN
INTRAVENOUS | Status: DC | PRN
Start: 2021-02-19 — End: 2021-02-19

## 2021-02-19 MED ORDER — ACETAMINOPHEN 325 MG PO TABS: 650.0000 mg | ORAL_TABLET | ORAL | Status: DC | PRN

## 2021-02-19 MED ORDER — FENTANYL CITRATE (PF) 100 MCG/2ML IJ SOLN
25.0000 ug | INTRAMUSCULAR | Status: DC | PRN
  Administered 2021-02-19: 25 ug via INTRAVENOUS

## 2021-02-19 MED ORDER — GLYCOPYRROLATE 0.2 MG/ML IJ SOLN
INTRAMUSCULAR | Status: DC | PRN
  Administered 2021-02-19: .8 mg via INTRAVENOUS

## 2021-02-19 MED ORDER — ACETAMINOPHEN 10 MG/ML IV SOLN
INTRAVENOUS | Status: DC | PRN
  Administered 2021-02-19: 1000 mg via INTRAVENOUS

## 2021-02-19 MED ORDER — BUPIVACAINE HCL 0.5 % IJ SOLN
INTRAMUSCULAR | Status: DC | PRN
  Administered 2021-02-19: 20 mL

## 2021-02-19 MED ORDER — LIDOCAINE HCL (CARDIAC) PF 100 MG/5ML IV SOSY
PREFILLED_SYRINGE | INTRAVENOUS | Status: DC | PRN
Start: 2021-02-19 — End: 2021-02-19
  Administered 2021-02-19: 80 mg via INTRAVENOUS

## 2021-02-19 MED ORDER — PHENYLEPHRINE HCL (PRESSORS) 10 MG/ML IV SOLN
INTRAVENOUS | Status: DC | PRN
Start: 2021-02-19 — End: 2021-02-19
  Administered 2021-02-19: 80 ug via INTRAVENOUS
  Administered 2021-02-19: 240 ug via INTRAVENOUS
  Administered 2021-02-19 (×2): 160 ug via INTRAVENOUS

## 2021-02-19 MED ORDER — ALBUTEROL SULFATE HFA 108 (90 BASE) MCG/ACT IN AERS
INHALATION_SPRAY | RESPIRATORY_TRACT | Status: DC | PRN
  Administered 2021-02-19 (×2): 2 via RESPIRATORY_TRACT

## 2021-02-19 MED ORDER — FENTANYL CITRATE (PF) 100 MCG/2ML IJ SOLN
100.0000 ug | Freq: Once | INTRAMUSCULAR | Status: AC
  Administered 2021-02-19: 100 ug via INTRAVENOUS
  Filled 2021-02-19: qty 2

## 2021-02-19 MED ORDER — PROPOFOL 10 MG/ML IV BOLUS
INTRAVENOUS | Status: DC | PRN
Start: 2021-02-19 — End: 2021-02-19
  Administered 2021-02-19: 150 mg via INTRAVENOUS

## 2021-02-19 MED ORDER — EPHEDRINE SULFATE (PRESSORS) 50 MG/ML IJ SOLN
INTRAMUSCULAR | Status: DC | PRN
  Administered 2021-02-19: 7.5 mg via INTRAVENOUS
  Administered 2021-02-19: 5 mg via INTRAVENOUS

## 2021-02-19 MED ORDER — MIDAZOLAM HCL 2 MG/2ML IJ SOLN
INTRAMUSCULAR | Status: AC
  Filled 2021-02-19: qty 2

## 2021-02-19 MED ORDER — ONDANSETRON HCL 4 MG/2ML IJ SOLN
INTRAMUSCULAR | Status: AC
  Filled 2021-02-19: qty 2

## 2021-02-19 MED ORDER — ACETAMINOPHEN 10 MG/ML IV SOLN
INTRAVENOUS | Status: AC
  Filled 2021-02-19: qty 100

## 2021-02-19 MED ORDER — SODIUM CHLORIDE 0.9 % IR SOLN
Status: DC | PRN
  Administered 2021-02-19: 1000 mL

## 2021-02-19 MED ORDER — DOCUSATE SODIUM 100 MG PO CAPS
100.0000 mg | ORAL_CAPSULE | Freq: Every day | ORAL | Status: DC
  Administered 2021-02-20: 100 mg via ORAL
  Filled 2021-02-19: qty 1

## 2021-02-19 MED ORDER — ACETAMINOPHEN 500 MG PO TABS
ORAL_TABLET | ORAL | Status: AC
  Filled 2021-02-19: qty 2

## 2021-02-19 MED ORDER — HYDROMORPHONE HCL 1 MG/ML IJ SOLN
INTRAMUSCULAR | Status: DC | PRN
  Administered 2021-02-19 (×2): .5 mg via INTRAVENOUS

## 2021-02-19 MED ORDER — HYDROMORPHONE HCL 1 MG/ML IJ SOLN
1.0000 mg | Freq: Once | INTRAMUSCULAR | Status: AC
Start: 2021-02-19 — End: 2021-02-19
  Administered 2021-02-19: 1 mg via INTRAVENOUS
  Filled 2021-02-19: qty 1

## 2021-02-19 MED ORDER — NEOSTIGMINE METHYLSULFATE 10 MG/10ML IV SOLN
INTRAVENOUS | Status: DC | PRN
  Administered 2021-02-19: 4 mg via INTRAVENOUS

## 2021-02-19 MED ORDER — FENTANYL CITRATE (PF) 100 MCG/2ML IJ SOLN
INTRAMUSCULAR | Status: AC
  Administered 2021-02-19: 25 ug via INTRAVENOUS
  Filled 2021-02-19: qty 2

## 2021-02-19 MED ORDER — GLYCOPYRROLATE 0.2 MG/ML IJ SOLN
INTRAMUSCULAR | Status: AC
  Filled 2021-02-19: qty 1

## 2021-02-19 MED ORDER — DEXAMETHASONE SODIUM PHOSPHATE 10 MG/ML IJ SOLN
INTRAMUSCULAR | Status: DC | PRN
  Administered 2021-02-19: 8 mg via INTRAVENOUS

## 2021-02-19 MED ORDER — HYDROMORPHONE HCL 1 MG/ML IJ SOLN
1.0000 mg | INTRAMUSCULAR | Status: DC | PRN
  Administered 2021-02-19 – 2021-02-20 (×7): 1 mg via INTRAVENOUS
  Filled 2021-02-19 (×7): qty 1

## 2021-02-19 MED ORDER — NEOSTIGMINE METHYLSULFATE 10 MG/10ML IV SOLN
INTRAVENOUS | Status: AC
  Filled 2021-02-19: qty 1

## 2021-02-19 MED ORDER — HEMOSTATIC AGENTS (NO CHARGE) OPTIME
TOPICAL | Status: DC | PRN
Start: 2021-02-19 — End: 2021-02-19
  Administered 2021-02-19: 1 via TOPICAL

## 2021-02-19 MED ORDER — LACTATED RINGERS IV SOLN: INTRAVENOUS | Status: DC

## 2021-02-19 MED ORDER — ONDANSETRON HCL 4 MG/2ML IJ SOLN
4.0000 mg | Freq: Once | INTRAMUSCULAR | Status: AC
  Administered 2021-02-19: 4 mg via INTRAVENOUS

## 2021-02-19 MED ORDER — ZOLPIDEM TARTRATE 5 MG PO TABS: 5.0000 mg | ORAL_TABLET | Freq: Every evening | ORAL | Status: DC | PRN

## 2021-02-19 MED ORDER — PROPOFOL 10 MG/ML IV BOLUS
INTRAVENOUS | Status: AC
  Filled 2021-02-19: qty 20

## 2021-02-19 MED ORDER — DEXAMETHASONE SODIUM PHOSPHATE 10 MG/ML IJ SOLN
INTRAMUSCULAR | Status: AC
  Filled 2021-02-19: qty 1

## 2021-02-19 MED ORDER — ONDANSETRON HCL 4 MG/2ML IJ SOLN: 4.0000 mg | Freq: Once | INTRAMUSCULAR | Status: DC | PRN

## 2021-02-19 MED ORDER — ONDANSETRON HCL 4 MG/2ML IJ SOLN
INTRAMUSCULAR | Status: DC | PRN
Start: 2021-02-19 — End: 2021-02-19
  Administered 2021-02-19: 4 mg via INTRAVENOUS

## 2021-02-19 MED ORDER — CALCIUM CARBONATE ANTACID 500 MG PO CHEW: 2.0000 | CHEWABLE_TABLET | ORAL | Status: DC | PRN

## 2021-02-19 MED ORDER — 0.9 % SODIUM CHLORIDE (POUR BTL) OPTIME
TOPICAL | Status: DC | PRN
  Administered 2021-02-19: 500 mL

## 2021-02-19 MED ORDER — GLYCOPYRROLATE 0.2 MG/ML IJ SOLN
INTRAMUSCULAR | Status: AC
  Filled 2021-02-19: qty 2

## 2021-02-19 MED ORDER — EPHEDRINE 5 MG/ML INJ
INTRAVENOUS | Status: AC
  Filled 2021-02-19: qty 5

## 2021-02-19 MED ORDER — PHENYLEPHRINE HCL-NACL 20-0.9 MG/250ML-% IV SOLN
INTRAVENOUS | Status: DC | PRN
  Administered 2021-02-19: 50 ug/min via INTRAVENOUS

## 2021-02-19 MED ORDER — ROCURONIUM BROMIDE 10 MG/ML (PF) SYRINGE
PREFILLED_SYRINGE | INTRAVENOUS | Status: AC
  Filled 2021-02-19: qty 10

## 2021-02-19 MED ORDER — FENTANYL CITRATE (PF) 100 MCG/2ML IJ SOLN
INTRAMUSCULAR | Status: DC | PRN
  Administered 2021-02-19 (×2): 50 ug via INTRAVENOUS

## 2021-02-19 SURGICAL SUPPLY — 45 items
APPLICATOR ARISTA FLEXITIP XL (MISCELLANEOUS) ×2 IMPLANT
BAG URINE DRAIN 2000ML AR STRL (UROLOGICAL SUPPLIES) ×2 IMPLANT
BLADE SURG SZ11 CARB STEEL (BLADE) ×2 IMPLANT
CHLORAPREP W/TINT 26 (MISCELLANEOUS) ×2 IMPLANT
CORD MONOPOLAR M/FML 12FT (MISCELLANEOUS) ×2 IMPLANT
DERMABOND ADVANCED (GAUZE/BANDAGES/DRESSINGS) ×1
DERMABOND ADVANCED .7 DNX12 (GAUZE/BANDAGES/DRESSINGS) ×1 IMPLANT
GAUZE 4X4 16PLY ~~LOC~~+RFID DBL (SPONGE) ×2 IMPLANT
GLOVE SURG ENC MOIS LTX SZ7 (GLOVE) ×4 IMPLANT
GLOVE SURG SYN 8.0 (GLOVE) ×2 IMPLANT
GLOVE SURG SYN 8.0 PF PI (GLOVE) ×1 IMPLANT
GLOVE SURG UNDER LTX SZ7.5 (GLOVE) ×2 IMPLANT
GOWN STRL REUS W/ TWL LRG LVL3 (GOWN DISPOSABLE) ×2 IMPLANT
GOWN STRL REUS W/ TWL XL LVL3 (GOWN DISPOSABLE) ×1 IMPLANT
GOWN STRL REUS W/TWL LRG LVL3 (GOWN DISPOSABLE) ×2
GOWN STRL REUS W/TWL XL LVL3 (GOWN DISPOSABLE) ×1
GRASPER SUT TROCAR 14GX15 (MISCELLANEOUS) ×1 IMPLANT
HEMOSTAT ARISTA ABSORB 3G PWDR (HEMOSTASIS) ×1 IMPLANT
IRRIGATION STRYKERFLOW (MISCELLANEOUS) IMPLANT
IRRIGATOR STRYKERFLOW (MISCELLANEOUS) ×2
IV NS 1000ML (IV SOLUTION) ×1
IV NS 1000ML BAXH (IV SOLUTION) ×1 IMPLANT
KIT PINK PAD W/HEAD ARE REST (MISCELLANEOUS) ×2 IMPLANT
KIT PINK PAD W/HEAD ARM REST (MISCELLANEOUS) ×1 IMPLANT
KIT TURNOVER CYSTO (KITS) ×2 IMPLANT
LABEL OR SOLS (LABEL) ×2 IMPLANT
MANIFOLD NEPTUNE II (INSTRUMENTS) ×2 IMPLANT
NS IRRIG 500ML POUR BTL (IV SOLUTION) ×2 IMPLANT
PACK GYN LAPAROSCOPIC (MISCELLANEOUS) ×2 IMPLANT
PAD OB MATERNITY 4.3X12.25 (PERSONAL CARE ITEMS) ×2 IMPLANT
PAD PREP 24X41 OB/GYN DISP (PERSONAL CARE ITEMS) ×2 IMPLANT
POUCH SPECIMEN RETRIEVAL 10MM (ENDOMECHANICALS) ×1 IMPLANT
SCRUB EXIDINE 4% CHG 4OZ (MISCELLANEOUS) ×2 IMPLANT
SET TUBE SMOKE EVAC HIGH FLOW (TUBING) ×2 IMPLANT
SHEARS HARMONIC ACE PLUS 36CM (ENDOMECHANICALS) ×1 IMPLANT
SLEEVE ENDOPATH XCEL 5M (ENDOMECHANICALS) ×3 IMPLANT
SUT MNCRL AB 4-0 PS2 18 (SUTURE) ×2 IMPLANT
SUT VIC AB 2-0 UR6 27 (SUTURE) ×2 IMPLANT
SUT VIC AB 4-0 SH 27 (SUTURE) ×1
SUT VIC AB 4-0 SH 27XANBCTRL (SUTURE) ×1 IMPLANT
SUT VICRYL 0 AB UR-6 (SUTURE) ×1 IMPLANT
SYR 50ML LL SCALE MARK (SYRINGE) ×1 IMPLANT
TROCAR 5M 150ML BLDLS (TROCAR) ×1 IMPLANT
TROCAR XCEL NON-BLD 11X100MML (ENDOMECHANICALS) ×1 IMPLANT
TROCAR XCEL NON-BLD 5MMX100MML (ENDOMECHANICALS) ×2 IMPLANT

## 2021-02-19 NOTE — Progress Notes (Signed)
Patient back in Obs2 from ultrasound.

## 2021-02-19 NOTE — Progress Notes (Addendum)
Tammie Meyer is a 21 y.o. female. She is at [redacted]w[redacted]d gestation. Patient's last menstrual period was 10/03/2020 (approximate). Estimated Date of Delivery: 07/09/21  Prenatal care site: [facility]  Chief complaint: right sided groin pain  HPI: Tammie Meyer presents to L&D with complaints of severe right sided groin pain that radiates to her lower back and right side of abdomen. She states she woke up in excruciating pain around 4 am that was unrelieved with position changes and/or heat. She denies vaginal bleeding or LOF. She endorse fetal movement.   Factors complicating pregnancy: Left simple ovarian cyst 14.50 x 8.42 x 15.21cm Obesity Tobacco use in pregnancy  S: Resting comfortably. no CTX, no VB.no LOF,  Active fetal movement.   Maternal Medical History:  Past Medical Hx:  has a past medical history of Sleep-disordered breathing and Tonsillar hypertrophy.    Past Surgical Hx:  has no past surgical history on file.   No Known Allergies   Prior to Admission medications   Medication Sig Start Date End Date Taking? Authorizing Provider  amoxicillin (AMOXIL) 500 MG capsule Take 500 mg by mouth 2 (two) times daily.    [provider]  amoxicillin-clavulanate (AUGMENTIN) 875-125 MG tablet Take 1 tablet by mouth 2 (two) times daily. Patient not taking: Reported on 02/19/2021    [provider]  doxycycline (VIBRAMYCIN) 100 MG capsule Take 1 capsule (100 mg total) by mouth 2 (two) times daily. Patient not taking: Reported on 02/19/2021 01/29/20   Zigmund Gottron, NP  predniSONE (STERAPRED UNI-PAK 48 TAB) 10 MG (48) TBPK tablet Take by mouth daily. Patient not taking: Reported on 02/19/2021    [provider]    Social History: She  reports that she has been smoking cigarettes. She has been exposed to tobacco smoke. She has never used smokeless tobacco. She reports that she does not drink alcohol.  Family History: family history is not on file. ,no history of  gyn cancers  Review of Systems: A full review of systems was performed and negative except as noted in the HPI.    O:  BP 118/66 (BP Location: Left Arm)    Pulse 83    Temp 98.2 F (36.8 C) (Oral)    Resp 18    LMP 10/03/2020 (Approximate)  Results for orders placed or performed during the hospital encounter of 02/19/21 (from the past 48 hour(s))  Comprehensive metabolic panel   Collection Time: 02/19/21  5:34 AM  Result Value Ref Range   Sodium 135 135 - 145 mmol/L   Potassium 3.5 3.5 - 5.1 mmol/L   Chloride 108 98 - 111 mmol/L   CO2 20 (L) 22 - 32 mmol/L   Glucose, Bld 99 70 - 99 mg/dL   BUN 7 6 - 20 mg/dL   Creatinine, Ser 0.49 0.44 - 1.00 mg/dL   Calcium 8.6 (L) 8.9 - 10.3 mg/dL   Total Protein 6.5 6.5 - 8.1 g/dL   Albumin 3.1 (L) 3.5 - 5.0 g/dL   AST 14 (L) 15 - 41 U/L   ALT 14 0 - 44 U/L   Alkaline Phosphatase 67 38 - 126 U/L   Total Bilirubin 0.3 0.3 - 1.2 mg/dL   GFR, Estimated >60 >60 mL/min   Anion gap 7 5 - 15  Uric acid   Collection Time: 02/19/21  5:34 AM  Result Value Ref Range   Uric Acid, Serum 3.4 2.5 - 7.1 mg/dL  CBC on admission   Collection Time: 02/19/21  5:34  AM  Result Value Ref Range   WBC 11.3 (H) 4.0 - 10.5 K/uL   RBC 3.67 (L) 3.87 - 5.11 MIL/uL   Hemoglobin 11.6 (L) 12.0 - 15.0 g/dL   HCT 32.5 (L) 36.0 - 46.0 %   MCV 88.6 80.0 - 100.0 fL   MCH 31.6 26.0 - 34.0 pg   MCHC 35.7 30.0 - 36.0 g/dL   RDW 12.8 11.5 - 15.5 %   Platelets 229 150 - 400 K/uL   nRBC 0.0 0.0 - 0.2 %  Urinalysis, Routine w reflex microscopic Urine, Clean Catch   Collection Time: 02/19/21  5:35 AM  Result Value Ref Range   Color, Urine YELLOW YELLOW   APPearance CLEAR CLEAR   Specific Gravity, Urine >1.030 (H) 1.005 - 1.030   pH 5.5 5.0 - 8.0   Glucose, UA NEGATIVE NEGATIVE mg/dL   Hgb urine dipstick NEGATIVE NEGATIVE   Bilirubin Urine SMALL (A) NEGATIVE   Ketones, ur NEGATIVE NEGATIVE mg/dL   Protein, ur 30 (A) NEGATIVE mg/dL   Nitrite NEGATIVE NEGATIVE    Leukocytes,Ua NEGATIVE NEGATIVE  Protein / creatinine ratio, urine   Collection Time: 02/19/21  5:35 AM  Result Value Ref Range   Creatinine, Urine 389 mg/dL   Total Protein, Urine 25 mg/dL   Protein Creatinine Ratio 0.06 0.00 - 0.15 mg/mg[Cre]  Urine Drug Screen, Qualitative ([facility] only)   Collection Time: 02/19/21  5:35 AM  Result Value Ref Range   Tricyclic, Ur Screen NONE DETECTED NONE DETECTED   Amphetamines, Ur Screen NONE DETECTED NONE DETECTED   MDMA (Ecstasy)Ur Screen NONE DETECTED NONE DETECTED   Cocaine Metabolite,Ur [place] NONE DETECTED NONE DETECTED   Opiate, Ur Screen POSITIVE (A) NONE DETECTED   Phencyclidine (PCP) Ur S NONE DETECTED NONE DETECTED   Cannabinoid 50 Ng, Ur [place] NONE DETECTED NONE DETECTED   Barbiturates, Ur Screen NONE DETECTED NONE DETECTED   Benzodiazepine, Ur Scrn NONE DETECTED NONE DETECTED   Methadone Scn, Ur NONE DETECTED NONE DETECTED  Urinalysis, Microscopic (reflex)   Collection Time: 02/19/21  5:35 AM  Result Value Ref Range   RBC / HPF NONE SEEN 0 - 5 RBC/hpf   WBC, UA 6-10 0 - 5 WBC/hpf   Bacteria, UA NONE SEEN NONE SEEN   Squamous Epithelial / LPF 6-10 0 - 5   Mucus PRESENT       Constitutional: NAD, AAOx3  HE/ENT: extraocular movements grossly intact, moist mucous membranes CV: RRR PULM: nl respiratory effort, CTABL Abd: gravid, non-distended, soft  Ext: Non-tender, Nonedmeatous Psych: mood appropriate, speech normal Pelvic : deferred SVE:   deferred  Fetal Monitor: Doppler- 150 bpm  Assessment: 21 y.o. [redacted]w[redacted]d here for antenatal surveillance during pregnancy.  Principle diagnosis:  Diagnoses of Abdominal pain in pregnancy, second trimester, Right groin pain, Acute right flank pain, and Abdominal pain during pregnancy in second trimester were pertinent to this visit.   Plan: Renal US to rule out kidney stones IMPRESSION: 1. No evidence for hydronephrosis in either kidney. 2. Large simple appearing cyst in the midline/right  paramidline upper pelvis/lower abdomen measuring 13.3 x 8.2 x 15.3 cm. 01/03/2021 clinical note from [facility] on [facility] everywhere documented a "Left (midline) simple ovarian cyst=14.50 x 8.42 x 15.21cm". 3. No evidence for intraperitoneal free fluid. Labs-essentially normal IV fluids/Pain medication    ----- Avelino Leeds, CNM Certified Nurse Midwife [facility]

## 2021-02-19 NOTE — Op Note (Signed)
[employer] PROCEDURE DATE: 02/19/2021  INDICATIONS: Acute pelvic pain  PREOPERATIVE DIAGNOSIS:  -Second trimester pregnancy -15 cm ovarian cyst on the right -Surgical abdomen with acute right sided pain  POSTOPERATIVE DIAGNOSIS: Same PROCEDURE: Exam under anesthesia, diagnostic laparoscopy, right sided ovarian cystectomy, lysis of omental adhesions SURGEON:  Dr. Benjaman Kindler ASSISTANT: CST ANESTHESIOLOGIST: No responsible provider has been recorded for the case. Anesthesiologist: Molli Barrows, MD CRNA: Lia Foyer, CRNA; Loletha Grayer, CRNA Student Nurse Anesthetist: Bea Graff, RN  INDICATIONS: 21 y.o. G1P0 at [facility] with history of acute-onset pelvic pain desiring surgical evaluation. She developed nausea and her pain was unable to be controlled with iv meds. No findings of appendicitis, renal calculus or other intraabdominal abnormality to explain her pain.    Please see preoperative notes for further details.  Risks of surgery were discussed with the patient including but not limited to: bleeding which may require transfusion or reoperation; infection which may require antibiotics; injury to bowel, bladder, ureters or other surrounding organs; need for additional procedures including laparotomy; thromboembolic phenomenon, incisional problems and other postoperative/anesthesia complications. Written informed consent was obtained.    FINDINGS:  20wk uterus, right sided ovarian cyst.  Right ovary elongated approximately 5 cm x 2 cm under the cyst.  Normal fallopian tube, that was extended from the pelvic brim all the way to the right upper quadrant.  No ovarian torsion was noted.  Normal liver.  Dense adhesions between the omentum and the falciform ligament in the midline, elevating the liver and the bowels wrapped around the omentum.  Normal bowels.  Left ovary and left tube unable to be visualized due to obstructing uterus.  ANESTHESIA:    General INTRAVENOUS FLUIDS:  2000 ml ESTIMATED BLOOD LOSS: minimal CYST FLUID: 953ml URINE OUTPUT: 1000 ml SPECIMENS: Ovarian cystwall COMPLICATIONS: None immediate  PROCEDURE IN DETAIL:  The patient had sequential compression devices applied to her lower extremities while in the preoperative area. Fetal Doptones in preop area were normal.  She was then taken to the operating room where general anesthesia was administered and was found to be adequate.  She was placed in the supine position with a left lateral tilt, and was prepped and draped in a sterile manner.  A Foley catheter was inserted into her bladder and attached to constant drainage .  After an adequate timeout was performed, attention was turned to the left upper quadrant where a midclavicular 5 mm incision was made with the scalpel.  The Optiview 5-mm trocar and sleeve were then advanced without difficulty with the laparoscope under direct visualization into the abdomen.  The abdomen was then insufflated with carbon dioxide gas and adequate pneumoperitoneum was obtained.  We performed this laparoscopy at 12 mmHg.  A detailed survey of the patient's pelvis and abdomen revealed the findings as mentioned above. Two additional 6mm trocars were placed in the bilateral lower quadrants under direct visualization.  A third 67mm was placed in the umbilicus later in the case to assist with visualization and retraction.   The ovarian cyst was evaluated.  The cyst was punctured and placed on suction.  900 mL of clear fluid was drained.  The cyst wall was then dissected out from the normal ovarian tissue. Hemostasis was assured with minimal electrocautery, and filled with [place].  No bleeding was noted under 8 mm of pressure. The abdomen was irrigated and all fluid and blood removed.   The cyst wall was placed in a 10 mm bag, and extracted  through the umbilical port site.  The fascia was closed with a figure-of-eight using the PMI.  The operative site was surveyed, and it was found  to be hemostatic.  No intraoperative injury to surrounding organs was noted.  Her appendix was not visualized.  Pictures were taken of the quadrants and pelvis. The abdomen was desufflated and all instruments were then removed from the patient's abdomen. All incisions were closed with 4-0 Vicryl and Dermabond.   The patient tolerated the procedures well.  All instruments, needles, and sponge counts were correct x 2. The patient was taken to the recovery room in stable condition.  Fetal Doptone's in the recovery area were 132 bpm.

## 2021-02-19 NOTE — Progress Notes (Signed)
Patient awake/alert, very anxious, needs reassurance freq.  Fetal heart tones @ 1652:  132. Vitals stable, afebrile

## 2021-02-19 NOTE — OB Triage Note (Signed)
Pt is G1P0 at 20 weeks who presents with extreme 10/10 right sided abd pain. Pt states radiates from hip up under rib cage. Not in flank area. Pt is crying and yelling and unable to remain in bed. Pt states at 9 week visit she was told she had a lg fluid filled ovarian cyst. FHR 155. Pt denies bleeding or vaginal discharge

## 2021-02-19 NOTE — Anesthesia Preprocedure Evaluation (Signed)
Anesthesia Evaluation  Patient identified by MRN, date of birth, ID band Patient awake    Reviewed: Allergy & Precautions, H&P , NPO status , Patient's Chart, lab work & pertinent test results, reviewed documented beta blocker date and time   Airway Mallampati: II  TM Distance: >3 FB Neck ROM: full    Dental  (+) Teeth Intact   Pulmonary neg pulmonary ROS, Current Smoker,    Pulmonary exam normal        Cardiovascular negative cardio ROS Normal cardiovascular exam Rhythm:regular Rate:Normal     Neuro/Psych negative neurological ROS  negative psych ROS   GI/Hepatic negative GI ROS, Neg liver ROS,   Endo/Other  negative endocrine ROS  Renal/GU negative Renal ROS  negative genitourinary   Musculoskeletal   Abdominal   Peds  Hematology negative hematology ROS (+)   Anesthesia Other Findings Past Medical History: No date: Sleep-disordered breathing No date: Tonsillar hypertrophy Past Surgical History: No date: GUM SURGERY BMI    Body Mass Index: 35.55 kg/m     Reproductive/Obstetrics negative OB ROS                             Anesthesia Physical Anesthesia Plan  ASA: 2  Anesthesia Plan: General ETT   Post-op Pain Management:    Induction:   PONV Risk Score and Plan:   Airway Management Planned:   Additional Equipment:   Intra-op Plan:   Post-operative Plan:   Informed Consent: I have reviewed the patients History and Physical, chart, labs and discussed the procedure including the risks, benefits and alternatives for the proposed anesthesia with the patient or authorized representative who has indicated his/her understanding and acceptance.     Dental Advisory Given  Plan Discussed with: CRNA  Anesthesia Plan Comments:         Anesthesia Quick Evaluation

## 2021-02-19 NOTE — Progress Notes (Addendum)
Sway Guttierrez is a 21 y.o. female. She is at [redacted]w[redacted]d gestation. Patient's last menstrual period was 10/03/2020 (approximate). Estimated Date of Delivery: 07/09/21  Prenatal care site: [facility]  Right side groin and flank pain that extends across her abdomen and back. Has had 1 dose of Dilaudid this am and now pain is 10/10.   Factors complicating pregnancy: Left simple ovarian cyst 14.50 x 8.42 x 15.21cm Obesity Tobacco use in pregnancy  S:  Crying and sobbing with pain, writhing in the bed.   Maternal Medical History:  Past Medical Hx:  has a past medical history of Sleep-disordered breathing and Tonsillar hypertrophy.    Past Surgical Hx:  has no past surgical history on file.   No Known Allergies   Prior to Admission medications   Medication Sig Start Date End Date Taking? Authorizing Provider  amoxicillin (AMOXIL) 500 MG capsule Take 500 mg by mouth 2 (two) times daily.    [provider]  amoxicillin-clavulanate (AUGMENTIN) 875-125 MG tablet Take 1 tablet by mouth 2 (two) times daily. Patient not taking: Reported on 02/19/2021    [provider]  doxycycline (VIBRAMYCIN) 100 MG capsule Take 1 capsule (100 mg total) by mouth 2 (two) times daily. Patient not taking: Reported on 02/19/2021 01/29/20   Zigmund Gottron, NP  predniSONE (STERAPRED UNI-PAK 48 TAB) 10 MG (48) TBPK tablet Take by mouth daily. Patient not taking: Reported on 02/19/2021    [provider]    Social History: She  reports that she has been smoking cigarettes. She has been exposed to tobacco smoke. She has never used smokeless tobacco. She reports that she does not drink alcohol.  Family History: no history of gyn cancers  Review of Systems: A full review of systems was performed and negative except as noted in the HPI.    O:  BP 118/66 (BP Location: Left Arm)    Pulse 83    Temp 98.2 F (36.8 C) (Oral)    Resp 18    LMP 10/03/2020 (Approximate)  Results for orders  placed or performed during the hospital encounter of 02/19/21 (from the past 48 hour(s))  Comprehensive metabolic panel   Collection Time: 02/19/21  5:34 AM  Result Value Ref Range   Sodium 135 135 - 145 mmol/L   Potassium 3.5 3.5 - 5.1 mmol/L   Chloride 108 98 - 111 mmol/L   CO2 20 (L) 22 - 32 mmol/L   Glucose, Bld 99 70 - 99 mg/dL   BUN 7 6 - 20 mg/dL   Creatinine, Ser 0.49 0.44 - 1.00 mg/dL   Calcium 8.6 (L) 8.9 - 10.3 mg/dL   Total Protein 6.5 6.5 - 8.1 g/dL   Albumin 3.1 (L) 3.5 - 5.0 g/dL   AST 14 (L) 15 - 41 U/L   ALT 14 0 - 44 U/L   Alkaline Phosphatase 67 38 - 126 U/L   Total Bilirubin 0.3 0.3 - 1.2 mg/dL   GFR, Estimated >60 >60 mL/min   Anion gap 7 5 - 15  Uric acid   Collection Time: 02/19/21  5:34 AM  Result Value Ref Range   Uric Acid, Serum 3.4 2.5 - 7.1 mg/dL  CBC on admission   Collection Time: 02/19/21  5:34 AM  Result Value Ref Range   WBC 11.3 (H) 4.0 - 10.5 K/uL   RBC 3.67 (L) 3.87 - 5.11 MIL/uL   Hemoglobin 11.6 (L) 12.0 - 15.0 g/dL   HCT 32.5 (L) 36.0 - 46.0 %  MCV 88.6 80.0 - 100.0 fL   MCH 31.6 26.0 - 34.0 pg   MCHC 35.7 30.0 - 36.0 g/dL   RDW 12.8 11.5 - 15.5 %   Platelets 229 150 - 400 K/uL   nRBC 0.0 0.0 - 0.2 %  Urinalysis, Routine w reflex microscopic Urine, Clean Catch   Collection Time: 02/19/21  5:35 AM  Result Value Ref Range   Color, Urine YELLOW YELLOW   APPearance CLEAR CLEAR   Specific Gravity, Urine >1.030 (H) 1.005 - 1.030   pH 5.5 5.0 - 8.0   Glucose, UA NEGATIVE NEGATIVE mg/dL   Hgb urine dipstick NEGATIVE NEGATIVE   Bilirubin Urine SMALL (A) NEGATIVE   Ketones, ur NEGATIVE NEGATIVE mg/dL   Protein, ur 30 (A) NEGATIVE mg/dL   Nitrite NEGATIVE NEGATIVE   Leukocytes,Ua NEGATIVE NEGATIVE  Protein / creatinine ratio, urine   Collection Time: 02/19/21  5:35 AM  Result Value Ref Range   Creatinine, Urine 389 mg/dL   Total Protein, Urine 25 mg/dL   Protein Creatinine Ratio 0.06 0.00 - 0.15 mg/mg[Cre]  Urine Drug Screen,  Qualitative ([facility] only)   Collection Time: 02/19/21  5:35 AM  Result Value Ref Range   Tricyclic, Ur Screen NONE DETECTED NONE DETECTED   Amphetamines, Ur Screen NONE DETECTED NONE DETECTED   MDMA (Ecstasy)Ur Screen NONE DETECTED NONE DETECTED   Cocaine Metabolite,Ur [place] NONE DETECTED NONE DETECTED   Opiate, Ur Screen POSITIVE (A) NONE DETECTED   Phencyclidine (PCP) Ur S NONE DETECTED NONE DETECTED   Cannabinoid 50 Ng, Ur [place] NONE DETECTED NONE DETECTED   Barbiturates, Ur Screen NONE DETECTED NONE DETECTED   Benzodiazepine, Ur Scrn NONE DETECTED NONE DETECTED   Methadone Scn, Ur NONE DETECTED NONE DETECTED  Urinalysis, Microscopic (reflex)   Collection Time: 02/19/21  5:35 AM  Result Value Ref Range   RBC / HPF NONE SEEN 0 - 5 RBC/hpf   WBC, UA 6-10 0 - 5 WBC/hpf   Bacteria, UA NONE SEEN NONE SEEN   Squamous Epithelial / LPF 6-10 0 - 5   Mucus PRESENT       Constitutional: NAD, AAOx3, notable anxiety and pt reports signficant pain after dose of Fentanyl.   HE/ENT: extraocular movements grossly intact, moist mucous membranes CV: RRR PULM: nl respiratory effort, CTABL Abd: gravid, non-distended, soft diffusely tender, R>L Ext: Non-tender, Nonedematous Psych: mood appropriate, speech normal Pelvic : deferred SVE:   deferred  Fetal Monitor: Doppler- 150 bpm  Assessment: 21 y.o. [redacted]w[redacted]d here for antenatal surveillance during pregnancy.  Principle diagnosis:  Diagnoses of Abdominal pain in pregnancy, second trimester, Right groin pain, Acute right flank pain, and Abdominal pain during pregnancy in second trimester were pertinent to this visit.   Plan: Severe abdominal pain with known large ovarian simple cyst.  Concern for torsed ovary vs appendicitis; [place] Ab/pelv with doppler ordered and US appendix ordered.  D/w BEB   ----- Francetta Found, CNM  Certified Nurse Midwife [facility]

## 2021-02-19 NOTE — Progress Notes (Signed)
Patient seen and examined. Abd ultrasound read pending. Large cyst noted on u/s. Dopplers pending.   Her pain is 10/10 with an acute abdomen. Pain started sharply, woke her from sleep at 0500 this morning. Is NPO. Has had 1g dilaudid, 125mcg fentanyl, 1000mg  tylenol and pain decreased to 9/10.  Vomiting earlier which she attributes to pain.  15 cm ovarian cyst in mid-abdomen. Kidney stone ruled out. Most likely right ovarian torsion, and plan for dx lap after MRI to r/o appendicitis, which without fever and with sudden onset is much less likely. WBC elevated mildly to 11.  Plan for dx lap, right ovarian cystectomy when MRI results if negative.

## 2021-02-19 NOTE — Progress Notes (Signed)
Fetal heart tones obtained by Araceli and reported to be 130 beats per minute.

## 2021-02-19 NOTE — Progress Notes (Signed)
Ultrasound at bedside

## 2021-02-19 NOTE — Anesthesia Procedure Notes (Addendum)
Procedure Name: Intubation Date/Time: 02/19/2021 2:00 PM Performed by: Loletha Grayer, CRNA Pre-anesthesia Checklist: Patient identified, Patient being monitored, Timeout performed, Emergency Drugs available and Suction available Patient Re-evaluated:Patient Re-evaluated prior to induction Oxygen Delivery Method: Circle system utilized Preoxygenation: Pre-oxygenation with 100% oxygen Induction Type: IV induction and Rapid sequence Laryngoscope Size: McGraph and 4 Grade View: Grade I Tube type: Oral Tube size: 6.5 mm Number of attempts: 1 Airway Equipment and Method: Stylet Placement Confirmation: ETT inserted through vocal cords under direct vision, positive ETCO2 and breath sounds checked- equal and bilateral Secured at: 21 cm Tube secured with: Tape Dental Injury: Teeth and Oropharynx as per pre-operative assessment  Comments: Performed by Particia Nearing, SRNA x1 attempt without difficulty

## 2021-02-19 NOTE — H&P (Signed)
Tammie Meyer is a 21 y.o. female. She is at [redacted]w[redacted]d gestation. Patient's last menstrual period was 10/03/2020 (approximate). Estimated Date of Delivery: 07/09/21   Prenatal care site: [facility]   Right side groin and flank pain that extends across her abdomen and back. Has had 2 doses of Dilaudid this am and pain remains 10/10.    Factors complicating pregnancy: Left simple ovarian cyst 14.50 x 8.42 x 15.21cm Obesity Tobacco use in pregnancy   S:  Crying and sobbing with pain, writhing in the bed.    Maternal Medical History:  Past Medical Hx:  has a past medical history of Sleep-disordered breathing and Tonsillar hypertrophy.     Past Surgical Hx:  has no past surgical history on file.    No Known Allergies           Prior to Admission medications   Medication Sig Start Date End Date Taking? Authorizing Provider  amoxicillin (AMOXIL) 500 MG capsule Take 500 mg by mouth 2 (two) times daily.       [provider]  amoxicillin-clavulanate (AUGMENTIN) 875-125 MG tablet Take 1 tablet by mouth 2 (two) times daily. Patient not taking: Reported on 02/19/2021       [provider]  doxycycline (VIBRAMYCIN) 100 MG capsule Take 1 capsule (100 mg total) by mouth 2 (two) times daily. Patient not taking: Reported on 02/19/2021 01/29/20     Zigmund Gottron, NP  predniSONE (STERAPRED UNI-PAK 48 TAB) 10 MG (48) TBPK tablet Take by mouth daily. Patient not taking: Reported on 02/19/2021       [provider]      Social History: She  reports that she has been smoking cigarettes. She has been exposed to tobacco smoke. She has never used smokeless tobacco. She reports that she does not drink alcohol.   Family History: no history of gyn cancers   Review of Systems: A full review of systems was performed and negative except as noted in the HPI.     O:     Objective   BP 118/66 (BP Location: Left Arm)    Pulse 83    Temp 98.2 F (36.8 C) (Oral)    Resp 18     LMP 10/03/2020 (Approximate)       Results for orders placed or performed during the hospital encounter of 02/19/21 (from the past 48 hour(s))  Comprehensive metabolic panel    Collection Time: 02/19/21  5:34 AM  Result Value Ref Range    Sodium 135 135 - 145 mmol/L    Potassium 3.5 3.5 - 5.1 mmol/L    Chloride 108 98 - 111 mmol/L    CO2 20 (L) 22 - 32 mmol/L    Glucose, Bld 99 70 - 99 mg/dL    BUN 7 6 - 20 mg/dL    Creatinine, Ser 0.49 0.44 - 1.00 mg/dL    Calcium 8.6 (L) 8.9 - 10.3 mg/dL    Total Protein 6.5 6.5 - 8.1 g/dL    Albumin 3.1 (L) 3.5 - 5.0 g/dL    AST 14 (L) 15 - 41 U/L    ALT 14 0 - 44 U/L    Alkaline Phosphatase 67 38 - 126 U/L    Total Bilirubin 0.3 0.3 - 1.2 mg/dL    GFR, Estimated >60 >60 mL/min    Anion gap 7 5 - 15  Uric acid    Collection Time: 02/19/21  5:34 AM  Result Value Ref Range  Uric Acid, Serum 3.4 2.5 - 7.1 mg/dL  CBC on admission    Collection Time: 02/19/21  5:34 AM  Result Value Ref Range    WBC 11.3 (H) 4.0 - 10.5 K/uL    RBC 3.67 (L) 3.87 - 5.11 MIL/uL    Hemoglobin 11.6 (L) 12.0 - 15.0 g/dL    HCT 32.5 (L) 36.0 - 46.0 %    MCV 88.6 80.0 - 100.0 fL    MCH 31.6 26.0 - 34.0 pg    MCHC 35.7 30.0 - 36.0 g/dL    RDW 12.8 11.5 - 15.5 %    Platelets 229 150 - 400 K/uL    nRBC 0.0 0.0 - 0.2 %  Urinalysis, Routine w reflex microscopic Urine, Clean Catch    Collection Time: 02/19/21  5:35 AM  Result Value Ref Range    Color, Urine YELLOW YELLOW    APPearance CLEAR CLEAR    Specific Gravity, Urine >1.030 (H) 1.005 - 1.030    pH 5.5 5.0 - 8.0    Glucose, UA NEGATIVE NEGATIVE mg/dL    Hgb urine dipstick NEGATIVE NEGATIVE    Bilirubin Urine SMALL (A) NEGATIVE    Ketones, ur NEGATIVE NEGATIVE mg/dL    Protein, ur 30 (A) NEGATIVE mg/dL    Nitrite NEGATIVE NEGATIVE    Leukocytes,Ua NEGATIVE NEGATIVE  Protein / creatinine ratio, urine    Collection Time: 02/19/21  5:35 AM  Result Value Ref Range    Creatinine, Urine 389 mg/dL    Total  Protein, Urine 25 mg/dL    Protein Creatinine Ratio 0.06 0.00 - 0.15 mg/mg[Cre]  Urine Drug Screen, Qualitative ([facility] only)    Collection Time: 02/19/21  5:35 AM  Result Value Ref Range    Tricyclic, Ur Screen NONE DETECTED NONE DETECTED    Amphetamines, Ur Screen NONE DETECTED NONE DETECTED    MDMA (Ecstasy)Ur Screen NONE DETECTED NONE DETECTED    Cocaine Metabolite,Ur [place] NONE DETECTED NONE DETECTED    Opiate, Ur Screen POSITIVE (A) NONE DETECTED    Phencyclidine (PCP) Ur S NONE DETECTED NONE DETECTED    Cannabinoid 50 Ng, Ur [place] NONE DETECTED NONE DETECTED    Barbiturates, Ur Screen NONE DETECTED NONE DETECTED    Benzodiazepine, Ur Scrn NONE DETECTED NONE DETECTED    Methadone Scn, Ur NONE DETECTED NONE DETECTED  Urinalysis, Microscopic (reflex)    Collection Time: 02/19/21  5:35 AM  Result Value Ref Range    RBC / HPF NONE SEEN 0 - 5 RBC/hpf    WBC, UA 6-10 0 - 5 WBC/hpf    Bacteria, UA NONE SEEN NONE SEEN    Squamous Epithelial / LPF 6-10 0 - 5    Mucus PRESENT            Constitutional: NAD, AAOx3, notable anxiety and pt reports signficant pain after dose of Fentanyl.   HE/ENT: extraocular movements grossly intact, moist mucous membranes CV: RRR PULM: nl respiratory effort, CTABL Abd: gravid, non-distended, soft, significantly diffusely tender, R>L, writhing in bed despite pain meds, +rovsing and guarding Ext: Non-tender, Nonedematous Psych: mood appropriate, speech normal Pelvic : deferred SVE:   deferred   Fetal Monitor: Doppler- 150 bpm   Assessment: 21 y.o. [redacted]w[redacted]d here for sudden onset 10/10 abdominal pain, with known 15cm ovarian cyst.   Principle diagnosis:  Diagnoses of Abdominal pain in pregnancy, second trimester, Right groin pain, Acute right flank pain, and Abdominal pain during pregnancy in second trimester were pertinent to this visit.    Plan:  Severe abdominal pain with known large ovarian simple cyst. Unrelieved with dilaudid, fentanyl and  tylenol Concern for torsed ovary. Imaging for appendicistic and renal calculi neg. Plan for dx lap with LUQ entrance, RIGHT ovarian cystectomy She is quite concerned about anesthesia risks. I have reassured her as I am able. She is aware of risks to the pregnancy which are quite small. Laparoscopy with improved outcomes over laparotomy.  Risks of surgery including bleeding which may require transfusion or reoperation, infection, injury to bowel or other surrounding organs, need for additional procedures including laparotomy were explained to patient and written informed consent was obtained.  Patient has been NPO since last night and she will remain NPO for procedure. Anesthesia and OR aware.  Preoperative prophylactic antibiotics if indicated and SCDs ordered on call to the OR.  To OR when ready.

## 2021-02-19 NOTE — Progress Notes (Signed)
Patient transferred to same day surgery to be prepped for OR.

## 2021-02-19 NOTE — Transfer of Care (Signed)
Immediate Anesthesia Transfer of Care Note  [name]  Procedure(s) Performed: LAPAROSCOPIC OVARIAN CYSTECTOMY (Right: Pelvis)  Patient Location: PACU  Anesthesia Type:General  Level of Consciousness: awake and drowsy  Airway & Oxygen Therapy: Patient Spontanous Breathing  Post-op Assessment: Report given to RN and Post -op Vital signs reviewed and stable  Post vital signs: Reviewed and stable  Last Vitals:  Vitals Value Taken Time  BP 124/79 02/19/21 1645  Temp    Pulse 107 02/19/21 1648  Resp 19 02/19/21 1648  SpO2 94 % 02/19/21 1648  Vitals shown include unvalidated device data.  Last Pain:  Vitals:   02/19/21 1301  TempSrc: Oral  PainSc: 0-No pain         Complications: No notable events documented.

## 2021-02-20 ENCOUNTER — Observation Stay: Payer: Medicaid Other

## 2021-02-20 ENCOUNTER — Encounter: Payer: Self-pay | Admitting: Obstetrics and Gynecology

## 2021-02-20 DIAGNOSIS — O3482 Maternal care for other abnormalities of pelvic organs, second trimester: Secondary | ICD-10-CM | POA: Diagnosis not present

## 2021-02-20 LAB — TYPE AND SCREEN
ABO/RH(D): A POS
Antibody Screen: NEGATIVE

## 2021-02-20 LAB — CBC
HCT: 27.1 % — ABNORMAL LOW (ref 36.0–46.0)
Hemoglobin: 9.4 g/dL — ABNORMAL LOW (ref 12.0–15.0)
MCH: 31.1 pg (ref 26.0–34.0)
MCHC: 34.7 g/dL (ref 30.0–36.0)
MCV: 89.7 fL (ref 80.0–100.0)
Platelets: 189 10*3/uL (ref 150–400)
RBC: 3.02 MIL/uL — ABNORMAL LOW (ref 3.87–5.11)
RDW: 12.8 % (ref 11.5–15.5)
WBC: 10.8 10*3/uL — ABNORMAL HIGH (ref 4.0–10.5)
nRBC: 0 % (ref 0.0–0.2)

## 2021-02-20 MED ORDER — ACETAMINOPHEN 500 MG PO TABS
1000.0000 mg | ORAL_TABLET | Freq: Four times a day (QID) | ORAL | 0 refills | Status: AC | PRN
Start: 2021-02-20 — End: ?

## 2021-02-20 MED ORDER — SIMETHICONE 80 MG PO CHEW
80.0000 mg | CHEWABLE_TABLET | Freq: Four times a day (QID) | ORAL | Status: DC | PRN
  Administered 2021-02-20: 80 mg via ORAL
  Filled 2021-02-20: qty 1

## 2021-02-20 MED ORDER — SIMETHICONE 80 MG PO CHEW: 80.0000 mg | CHEWABLE_TABLET | Freq: Four times a day (QID) | ORAL | 0 refills | Status: DC | PRN

## 2021-02-20 MED ORDER — OXYCODONE HCL 5 MG PO TABS: 5.0000 mg | ORAL_TABLET | ORAL | 0 refills | Status: AC | PRN

## 2021-02-20 MED ORDER — OXYCODONE HCL 5 MG PO TABS
5.0000 mg | ORAL_TABLET | ORAL | Status: DC | PRN
  Administered 2021-02-20: 5 mg via ORAL
  Filled 2021-02-20: qty 1

## 2021-02-20 NOTE — Progress Notes (Signed)
Patient discharged home with family.  Discharge instructions, when to follow up, and prescriptions reviewed with patient.  Patient verbalized understanding. Patient will be escorted out by auxiliary.   

## 2021-02-20 NOTE — Discharge Summary (Signed)
Patient ID: Tammie Meyer MRN: [id number] DOB/AGE: [birth year] 21 y.o.  Admit date: 02/19/2021 Discharge date: 02/20/2021  Admission Diagnoses: Second trimester pregnancy, 15cm Ovarian cyst on the right, surgical abdomen with acute right sided pain  Discharge Diagnoses: second trimester pregnancy  Prenatal Care Site: [facility]  Prenatal Procedures: Ovarian cystectomy 02/19/21, anatomy US 02/20/21  Consults: None  Significant Diagnostic Studies:  Results for orders placed or performed during the hospital encounter of 02/19/21 (from the past 168 hour(s))  Comprehensive metabolic panel   Collection Time: 02/19/21  5:34 AM  Result Value Ref Range   Sodium 135 135 - 145 mmol/L   Potassium 3.5 3.5 - 5.1 mmol/L   Chloride 108 98 - 111 mmol/L   CO2 20 (L) 22 - 32 mmol/L   Glucose, Bld 99 70 - 99 mg/dL   BUN 7 6 - 20 mg/dL   Creatinine, Ser 0.49 0.44 - 1.00 mg/dL   Calcium 8.6 (L) 8.9 - 10.3 mg/dL   Total Protein 6.5 6.5 - 8.1 g/dL   Albumin 3.1 (L) 3.5 - 5.0 g/dL   AST 14 (L) 15 - 41 U/L   ALT 14 0 - 44 U/L   Alkaline Phosphatase 67 38 - 126 U/L   Total Bilirubin 0.3 0.3 - 1.2 mg/dL   GFR, Estimated >60 >60 mL/min   Anion gap 7 5 - 15  Uric acid   Collection Time: 02/19/21  5:34 AM  Result Value Ref Range   Uric Acid, Serum 3.4 2.5 - 7.1 mg/dL  CBC on admission   Collection Time: 02/19/21  5:34 AM  Result Value Ref Range   WBC 11.3 (H) 4.0 - 10.5 K/uL   RBC 3.67 (L) 3.87 - 5.11 MIL/uL   Hemoglobin 11.6 (L) 12.0 - 15.0 g/dL   HCT 32.5 (L) 36.0 - 46.0 %   MCV 88.6 80.0 - 100.0 fL   MCH 31.6 26.0 - 34.0 pg   MCHC 35.7 30.0 - 36.0 g/dL   RDW 12.8 11.5 - 15.5 %   Platelets 229 150 - 400 K/uL   nRBC 0.0 0.0 - 0.2 %  Urinalysis, Routine w reflex microscopic Urine, Clean Catch   Collection Time: 02/19/21  5:35 AM  Result Value Ref Range   Color, Urine YELLOW YELLOW   APPearance CLEAR CLEAR   Specific Gravity, Urine >1.030 (H) 1.005 - 1.030   pH 5.5 5.0 - 8.0    Glucose, UA NEGATIVE NEGATIVE mg/dL   Hgb urine dipstick NEGATIVE NEGATIVE   Bilirubin Urine SMALL (A) NEGATIVE   Ketones, ur NEGATIVE NEGATIVE mg/dL   Protein, ur 30 (A) NEGATIVE mg/dL   Nitrite NEGATIVE NEGATIVE   Leukocytes,Ua NEGATIVE NEGATIVE  Protein / creatinine ratio, urine   Collection Time: 02/19/21  5:35 AM  Result Value Ref Range   Creatinine, Urine 389 mg/dL   Total Protein, Urine 25 mg/dL   Protein Creatinine Ratio 0.06 0.00 - 0.15 mg/mg[Cre]  Urine Drug Screen, Qualitative ([facility] only)   Collection Time: 02/19/21  5:35 AM  Result Value Ref Range   Tricyclic, Ur Screen NONE DETECTED NONE DETECTED   Amphetamines, Ur Screen NONE DETECTED NONE DETECTED   MDMA (Ecstasy)Ur Screen NONE DETECTED NONE DETECTED   Cocaine Metabolite,Ur [place] NONE DETECTED NONE DETECTED   Opiate, Ur Screen POSITIVE (A) NONE DETECTED   Phencyclidine (PCP) Ur S NONE DETECTED NONE DETECTED   Cannabinoid 50 Ng, Ur [place] NONE DETECTED NONE DETECTED   Barbiturates, Ur Screen NONE DETECTED NONE DETECTED   Benzodiazepine,  Ur Scrn NONE DETECTED NONE DETECTED   Methadone Scn, Ur NONE DETECTED NONE DETECTED  Urinalysis, Microscopic (reflex)   Collection Time: 02/19/21  5:35 AM  Result Value Ref Range   RBC / HPF NONE SEEN 0 - 5 RBC/hpf   WBC, UA 6-10 0 - 5 WBC/hpf   Bacteria, UA NONE SEEN NONE SEEN   Squamous Epithelial / LPF 6-10 0 - 5   Mucus PRESENT   Resp Panel by RT-PCR (Flu A&B, Covid) Nasopharyngeal Swab   Collection Time: 02/19/21  9:26 AM   Specimen: Nasopharyngeal Swab; Nasopharyngeal(NP) swabs in vial transport medium  Result Value Ref Range   SARS Coronavirus 2 by RT PCR NEGATIVE NEGATIVE   Influenza A by PCR NEGATIVE NEGATIVE   Influenza B by PCR NEGATIVE NEGATIVE  Type and screen [place]   Collection Time: 02/19/21  2:13 PM  Result Value Ref Range   ABO/RH(D) A POS    Antibody Screen NEG    Sample Expiration      02/22/2021,2359 Performed at [facility]  Lab, [address] [place], [place] [address]   CBC   Collection Time: 02/20/21  5:38 AM  Result Value Ref Range   WBC 10.8 (H) 4.0 - 10.5 K/uL   RBC 3.02 (L) 3.87 - 5.11 MIL/uL   Hemoglobin 9.4 (L) 12.0 - 15.0 g/dL   HCT 27.1 (L) 36.0 - 46.0 %   MCV 89.7 80.0 - 100.0 fL   MCH 31.1 26.0 - 34.0 pg   MCHC 34.7 30.0 - 36.0 g/dL   RDW 12.8 11.5 - 15.5 %   Platelets 189 150 - 400 K/uL   nRBC 0.0 0.0 - 0.2 %   Anatomy US: OBSTETRICAL ULTRASOUND >14 WKS   FINDINGS: Number of Fetuses: 1   Heart Rate:  167 bpm   Movement: Yes   Presentation: Cephalic   Previa: No   Placental Location: Posterior   Amniotic Fluid (Subjective): Normal   Amniotic Fluid (Objective):   Vertical pocket = 4.9cm   FETAL BIOMETRY   BPD: 7.75cm 20w 3d   HC:   17.08cm 19w 5d   AC:   16.89cm 21w 6d   FL:   3.14cm 19w 5d   Current Mean GA: 20w 3d [place] EDC: 07/07/2021   Assigned GA:  20w 1d assigned EDC: 07/09/2021   FETAL ANATOMY   Lateral Ventricles: Appears normal   Thalami/CSP: Not visualized   Posterior Fossa:  Not visualized   Nuchal Region: Not visualized NFT= N/A > 20 WKS   Upper Lip: Appears normal   Spine: Appears normal   4 Chamber Heart on Left: Appears normal   LVOT: Appears normal   RVOT: Not visualized   Stomach on Left: Appears normal   3 Vessel Cord: Appears normal   Cord Insertion site: Appears normal   Kidneys: Appears normal   Bladder: Appears normal   Extremities: Appears normal   Sex: Female   Technically difficult due to: None   Maternal Findings:   Cervix:  3.1 cm.  Closed.   IMPRESSION: 1. Single live intrauterine pregnancy as detailed above. 2. RVOT, thalami, nuchal region, posterior fossa are not well visualized. Recommend follow-up limited ultrasound in 4 weeks for re-evaluation. 3. Otherwise, no focal fetal anatomic abnormality.  Treatments: analgesia: she was receiving Dilaudid 1-2mg  q3hrs IV PRN severe pain, transitioned to  Oxycodone 5mg  q4hrs PO PRN severe pain.   Hospital Course:  This is a 21 y.o. G1P0 with IUP at [redacted]w[redacted]d admitted  for acute pelvic pain and ovarian cystectomy.  No leaking of fluid and no bleeding.  After surgery, her pain was well controlled on IV then oral pain medications and she received her anatomy US.  She was deemed stable for discharge to home with outpatient follow up.  Discharge Physical Exam:   BP 120/65    Pulse 69    Temp 97.8 F (36.6 C) (Oral)    Resp (!) 24    Ht 5\' 7"  (1.702 m)    Wt 103 kg    LMP 10/03/2020 (Approximate)    SpO2 100%    BMI 35.56 kg/m   General: NAD CV: RRR Pulm: CTABL, nl effort ABD: s/nd/nt, gravid DVT Evaluation: LE non-ttp, no evidence of DVT on exam.  SVE:  not indicated Fetal monitoring:  167 bpm by anatomy US  Discharge Condition: Stable  Disposition: Discharge disposition: 01-Home or Self Care       Discharge Instructions     Discharge activity:  No Restrictions   Complete by: As directed    Discharge diet:  No restrictions   Complete by: As directed    Notify physician for a general feeling that "something is not right"   Complete by: As directed    Notify physician for increase or change in vaginal discharge   Complete by: As directed    Notify physician for intestinal cramps, with or without diarrhea, sometimes described as "gas pain"   Complete by: As directed    Notify physician for leaking of fluid   Complete by: As directed    Notify physician for low, dull backache, unrelieved by heat or Tylenol   Complete by: As directed    Notify physician for menstrual like cramps   Complete by: As directed    Notify physician for pelvic pressure   Complete by: As directed    Notify physician for uterine contractions.  These may be painless and feel like the uterus is tightening or the baby is  "balling up"   Complete by: As directed    Notify physician for vaginal bleeding   Complete by: As directed    PRETERM LABOR:  Includes any  of the follwing symptoms that occur between 20 - [redacted] weeks gestation.  If these symptoms are not stopped, preterm labor can result in preterm delivery, placing your baby at risk   Complete by: As directed       Allergies as of 02/20/2021   No Known Allergies      Medication List     STOP taking these medications    amoxicillin 500 MG capsule Commonly known as: AMOXIL   amoxicillin-clavulanate 875-125 MG tablet Commonly known as: AUGMENTIN   doxycycline 100 MG capsule Commonly known as: VIBRAMYCIN   predniSONE 10 MG (48) Tbpk tablet Commonly known as: STERAPRED UNI-PAK 48 TAB       TAKE these medications    acetaminophen 500 MG tablet Commonly known as: TYLENOL Take 2 tablets (1,000 mg total) by mouth every 6 (six) hours as needed (for pain scale < 4  OR  temperature  >/=  100.5 F).   oxyCODONE 5 MG immediate release tablet Commonly known as: Oxy IR/ROXICODONE Take 1 tablet (5 mg total) by mouth every 4 (four) hours as needed for up to 3 days for severe pain or breakthrough pain.   simethicone 80 MG chewable tablet Commonly known as: MYLICON Chew 1 tablet (80 mg total) by mouth 4 (four) times daily as needed for flatulence.  Follow-up Information     Benjaman Kindler, MD Follow up in 2 week(s).   Specialty: Obstetrics and Gynecology Why: incision check Contact information: [place] [address] [phone]         [facility] Follow up in 4 week(s).   Why: anatomy US f/u, prenatal visit Contact information: [place] [phone]                Signed:  Gertie Fey 02/20/2021 1:29 PM ----- Lucrezia Europe, CNM Certified Nurse Midwife [facility]

## 2021-02-21 LAB — SURGICAL PATHOLOGY

## 2021-02-21 NOTE — Anesthesia Postprocedure Evaluation (Signed)
Anesthesia Post Note  [name]  Procedure(s) Performed: LAPAROSCOPIC OVARIAN CYSTECTOMY (Right: Pelvis)  Patient location during evaluation: PACU Anesthesia Type: General Level of consciousness: awake and alert Pain management: pain level controlled Vital Signs Assessment: post-procedure vital signs reviewed and stable Respiratory status: spontaneous breathing, nonlabored ventilation, respiratory function stable and patient connected to nasal cannula oxygen Cardiovascular status: blood pressure returned to baseline and stable Postop Assessment: no apparent nausea or vomiting Anesthetic complications: no   No notable events documented.   Last Vitals:  Vitals:   02/20/21 1200 02/20/21 1226  BP: (!) 121/58 120/65  Pulse: 72 69  Resp: 18 (!) 24  Temp: 36.6 C   SpO2: 99% 100%    Last Pain:  Vitals:   02/20/21 1406  TempSrc:   PainSc: [place] Shelvy Heckert

## 2021-03-11 ENCOUNTER — Observation Stay
Admission: EM | Admit: 2021-03-11 | Discharge: 2021-03-11 | Disposition: A | Discharge status: Home / Self Care | Payer: Medicaid Other

## 2021-03-11 DIAGNOSIS — O99330 Smoking (tobacco) complicating pregnancy, unspecified trimester: Secondary | ICD-10-CM | POA: Diagnosis not present

## 2021-03-11 DIAGNOSIS — Z3A22 22 weeks gestation of pregnancy: Secondary | ICD-10-CM | POA: Insufficient documentation

## 2021-03-11 DIAGNOSIS — F17210 Nicotine dependence, cigarettes, uncomplicated: Secondary | ICD-10-CM | POA: Diagnosis not present

## 2021-03-11 DIAGNOSIS — O99212 Obesity complicating pregnancy, second trimester: Secondary | ICD-10-CM | POA: Diagnosis not present

## 2021-03-11 DIAGNOSIS — E669 Obesity, unspecified: Secondary | ICD-10-CM | POA: Insufficient documentation

## 2021-03-11 DIAGNOSIS — Z906 Acquired absence of other parts of urinary tract: Secondary | ICD-10-CM | POA: Insufficient documentation

## 2021-03-11 DIAGNOSIS — O368120 Decreased fetal movements, second trimester, not applicable or unspecified: Principal | ICD-10-CM | POA: Insufficient documentation

## 2021-03-11 NOTE — OB Triage Note (Signed)
Pt discharged home in stable condition. RN provided discharge instructions to patient, including information on when to come back. Pt verbalized understanding and all questions answered at this time.

## 2021-03-11 NOTE — Discharge Summary (Addendum)
Tammie Meyer is a 21 y.o. female. She is at [redacted]w[redacted]d gestation. Patient's last menstrual period was 10/03/2020 (approximate). Estimated Date of Delivery: 07/09/21  Prenatal care site: [facility]  Chief complaint: decreased fetal movement   HPI: Tammie Meyer presents to L&D with complaints of decreased fetal movement over the past 2 days.  States she usually feels baby move everyday.  2 days ago she noticed movement seemed different and has not felt as frequent kicks as she usually does.   Factors complicating pregnancy: Cystectomy in pregnancy  Obesity  Tobacco use in pregnancy   S: Resting comfortably. no CTX, no VB.no LOF, decreased fetal movement over the past 2 days  Maternal Medical History:  Past Medical Hx:  has a past medical history of Sleep-disordered breathing and Tonsillar hypertrophy.    Past Surgical Hx:  has a past surgical history that includes Gum surgery and Laparoscopic ovarian cystectomy (Right, 02/19/2021).   No Known Allergies   Prior to Admission medications   Medication Sig Start Date End Date Taking? Authorizing Provider  acetaminophen (TYLENOL) 500 MG tablet Take 2 tablets (1,000 mg total) by mouth every 6 (six) hours as needed (for pain scale < 4  OR  temperature  >/=  100.5 F). 02/20/21   Gertie Fey, CNM  simethicone (MYLICON) 80 MG chewable tablet Chew 1 tablet (80 mg total) by mouth 4 (four) times daily as needed for flatulence. 02/20/21   Gertie Fey, CNM    Social History: She  reports that she has been smoking cigarettes. She has been exposed to tobacco smoke. She has never used smokeless tobacco. She reports that she does not drink alcohol and does not use drugs.  Family History: family history is not on file. , no history of gyn cancers  Review of Systems: A full review of systems was performed and negative except as noted in the HPI.    O:  LMP 10/03/2020 (Approximate)  No results found for this or any previous visit  (from the past 48 hour(s)).   Constitutional: NAD, AAOx3  CV: RRR PULM: nl respiratory effort Abd: gravid, non-tender, non-distended, soft  Ext: Non-tender, Nonedmeatous Psych: mood appropriate, speech normal Pelvic : deferred  Doppler: 160 bpm distinguished from maternal pulse   Assessment: 21 y.o. [redacted]w[redacted]d here for antenatal surveillance during pregnancy.  Principle diagnosis: Decreased fetal movement affecting management of pregnancy in second trimester [O36.8120] , reassuring fetal status   Plan: Doppler reassuring for gestational age  Discussed normal fetal movement patterns in 2nd trimester D/c home stable, precautions reviewed, follow-up as scheduled.   ----- Drinda Butts, CNM Certified Nurse Midwife [facility]

## 2021-03-11 NOTE — Discharge Instructions (Signed)
LABOR: When contractions begin, you should start to time them from the beginning of one contraction to the beginning of the next.  When contractions are 5-10 minutes apart or less and have been regular for at least an hour, you should call your health care provider.  Notify your doctor if any of the following occur: 1. Bleeding from the vagina 7. Sudden, constant, or occasional abdominal pain  2. Pain or burning when urinating 8. Sudden gushing of fluid from the vagina (with or without continued leaking)  3. Chills or fever 9. Fainting spells, "black outs" or loss of consciousness  4. Increase in vaginal discharge 10. Severe or continued nausea or vomiting  5. Pelvic pressure (sudden increase) 11. Blurring of vision or spots before the eyes  6. Baby moving less than usual 12. Leaking of fluid    FETAL KICK COUNT: Lie on your left side for one hour after a meal, and count the number of times your baby kicks. If it is less than 5 times, get up, move around and drink some juice. Repeat the test 30 minutes later. If it is still less than 5 kicks in an hour, notify your doctor.

## 2021-03-11 NOTE — OB Triage Note (Signed)
Pt arrived to birthplace with complaints of decreased FM x2 days. Pt denies ctx, LOF and vaginal bleeding. Doppler applied, FHT 160.

## 2021-03-29 ENCOUNTER — Encounter: Payer: Self-pay | Admitting: Obstetrics and Gynecology

## 2021-04-05 ENCOUNTER — Observation Stay
Admission: EM | Admit: 2021-04-05 | Discharge: 2021-04-05 | Disposition: A | Discharge status: Home / Self Care | Payer: Medicaid Other | Attending: Obstetrics | Admitting: Obstetrics

## 2021-04-05 ENCOUNTER — Other Ambulatory Visit: Payer: Self-pay

## 2021-04-05 ENCOUNTER — Encounter: Payer: Self-pay | Admitting: Obstetrics and Gynecology

## 2021-04-05 DIAGNOSIS — Z3A26 26 weeks gestation of pregnancy: Secondary | ICD-10-CM | POA: Diagnosis not present

## 2021-04-05 DIAGNOSIS — Z3490 Encounter for supervision of normal pregnancy, unspecified, unspecified trimester: Secondary | ICD-10-CM

## 2021-04-05 DIAGNOSIS — O368120 Decreased fetal movements, second trimester, not applicable or unspecified: Secondary | ICD-10-CM | POA: Diagnosis present

## 2021-04-05 DIAGNOSIS — Z87891 Personal history of nicotine dependence: Secondary | ICD-10-CM | POA: Insufficient documentation

## 2021-04-05 LAB — URINALYSIS, ROUTINE W REFLEX MICROSCOPIC
Bilirubin Urine: NEGATIVE
Glucose, UA: NEGATIVE mg/dL
Hgb urine dipstick: NEGATIVE
Ketones, ur: NEGATIVE mg/dL
Leukocytes,Ua: NEGATIVE
Nitrite: NEGATIVE
Protein, ur: NEGATIVE mg/dL
Specific Gravity, Urine: 1.023 (ref 1.005–1.030)
pH: 6 (ref 5.0–8.0)

## 2021-04-05 LAB — WET PREP, GENITAL
Clue Cells Wet Prep HPF POC: NONE SEEN
Sperm: NONE SEEN
Trich, Wet Prep: NONE SEEN
WBC, Wet Prep HPF POC: <10 — AB (ref <10)
Yeast Wet Prep HPF POC: NONE SEEN

## 2021-04-05 NOTE — Discharge Summary (Signed)
Tammie Meyer is a 21 y.o. female. She is at 59w3dgestation. Patient's last menstrual period was 10/03/2020 (approximate). ?Estimated Date of Delivery: 07/09/21 ? ?[facility] ? ?Chief complaint: c/o vaginal bleeding and decreased FM ? ?HPI: Darlis presents to L&D with complaints of c/o vaginal bleeding, decreased FM and back pain ? ?Factors complicating pregnancy: ?Tobacco use in pregnancy ?Ovarian Cystectomy in second trimester ?obesity ? ?S: Resting comfortably. no CTX, no VB.no LOF,  Active fetal movement.  ? ?Maternal Medical History:  ?Past Medical Hx:  has a past medical history of Sleep-disordered breathing and Tonsillar hypertrophy.   ? ?Past Surgical Hx:  has a past surgical history that includes Gum surgery and Laparoscopic ovarian cystectomy (Right, 02/19/2021).  ? ?No Known Allergies  ? ?Prior to Admission medications   ?Medication Sig Start Date End Date Taking? Authorizing Provider  ?Prenatal Vit-Fe Fumarate-FA (MULTIVITAMIN-PRENATAL) 27-0.8 MG TABS tablet Take 1 tablet by mouth daily at 12 noon.   Yes [provider]  ?acetaminophen (TYLENOL) 500 MG tablet Take 2 tablets (1,000 mg total) by mouth every 6 (six) hours as needed (for pain scale < 4  OR  temperature  >/=  100.5 F). ?Patient not taking: Reported on 04/05/2021 02/20/21   WGertie Fey CNM  ?simethicone (MYLICON) 80 MG chewable tablet Chew 1 tablet (80 mg total) by mouth 4 (four) times daily as needed for flatulence. ?Patient not taking: Reported on 04/05/2021 02/20/21   WGertie Fey CNM  ? ? ?Social History: She  reports that she has quit smoking. Her smoking use included cigarettes. She has been exposed to tobacco smoke. She has never used smokeless tobacco. She reports that she does not drink alcohol and does not use drugs. ? ?Family History: family history is not on file. ,no history of gyn cancers ? ?Review of Systems: A full review of systems was performed and negative except as  noted in the HPI.   ? ?O: ? BP 125/70 (BP Location: Left Arm)   Pulse 98   Temp 98.8 ?F (37.1 ?C) (Oral)   Resp 18   Ht '5\' 7"'$  (1.702 m)   Wt 104.3 kg   LMP 10/03/2020 (Approximate)   BMI 36.02 kg/m?  ?Results for orders placed or performed during the hospital encounter of 04/05/21 (from the past 48 hour(s))  ?Wet prep, genital  ? Collection Time: 04/05/21  2:07 AM  ? Specimen: Cervix  ?Result Value Ref Range  ? Yeast Wet Prep HPF POC NONE SEEN NONE SEEN  ? Trich, Wet Prep NONE SEEN NONE SEEN  ? Clue Cells Wet Prep HPF POC NONE SEEN NONE SEEN  ? WBC, Wet Prep HPF POC <10 (A) <10  ? Sperm NONE SEEN   ?Urinalysis, Routine w reflex microscopic Cervix  ? Collection Time: 04/05/21  2:07 AM  ?Result Value Ref Range  ? Color, Urine YELLOW (A) YELLOW  ? APPearance HAZY (A) CLEAR  ? Specific Gravity, Urine 1.023 1.005 - 1.030  ? pH 6.0 5.0 - 8.0  ? Glucose, UA NEGATIVE NEGATIVE mg/dL  ? Hgb urine dipstick NEGATIVE NEGATIVE  ? Bilirubin Urine NEGATIVE NEGATIVE  ? Ketones, ur NEGATIVE NEGATIVE mg/dL  ? Protein, ur NEGATIVE NEGATIVE mg/dL  ? Nitrite NEGATIVE NEGATIVE  ? Leukocytes,Ua NEGATIVE NEGATIVE  ?  ? ? ?Constitutional: NAD, AAOx3  ?HE/ENT: extraocular movements grossly intact, moist mucous membranes ?CV: RRR ?PULM: nl respiratory effort ?Abd: gravid, non-tender, non-distended, soft  ?Ext: Non-tender, Nonedmeatous ?Psych: mood appropriate, speech normal ?  Pelvic : deferred ?SVE:    ? ?Fetal Monitor: ?Baseline: 145 bpm ?Variability: moderate ?Accels: Present ?Decels: none ?Toco: none ? ?Category: I ?NST: reactive ? ? ?Assessment: 21 y.o. 47w3dhere for antenatal surveillance during pregnancy.There is no evidence of vaginal bleeding, LOF or uterine contractions at this time. ? ?Principle diagnosis: Pregnancy [Z34.90]  ? ?Plan: ?Labor: not present.  ?NST reviewed  ?Fetal Wellbeing: Reassuring Cat 1 tracing. ?Reactive NST  ?Labs neg ?D/c home stable, precautions reviewed, follow-up as scheduled.  ? ?----- ?FAvelino LeedsCNM ?Certified Nurse Midwife ?[facility] ?  ?

## 2021-04-05 NOTE — OB Triage Note (Signed)
Pt is a G1P0 at 31w3dpresenting to L&D triage c/o vaginal bleeding, back, and decreased FM. Pt states she had bright red blood on tissue after using the bathroom and then light pink after the second wipe. Pt states back pain began yesterday but has gotten better and rating it a 7/10. Monitors applied and assessing. VSS. ?

## 2021-05-20 ENCOUNTER — Other Ambulatory Visit: Payer: Self-pay | Admitting: Obstetrics and Gynecology

## 2021-05-20 ENCOUNTER — Observation Stay
Admission: EM | Admit: 2021-05-20 | Discharge: 2021-05-20 | Disposition: A | Discharge status: Home / Self Care | Payer: Medicaid Other | Attending: Obstetrics and Gynecology | Admitting: Obstetrics and Gynecology

## 2021-05-20 ENCOUNTER — Encounter: Payer: Self-pay | Admitting: Obstetrics and Gynecology

## 2021-05-20 ENCOUNTER — Other Ambulatory Visit: Payer: Self-pay

## 2021-05-20 DIAGNOSIS — N83202 Unspecified ovarian cyst, left side: Secondary | ICD-10-CM | POA: Insufficient documentation

## 2021-05-20 DIAGNOSIS — E669 Obesity, unspecified: Secondary | ICD-10-CM | POA: Diagnosis not present

## 2021-05-20 DIAGNOSIS — Z3A32 32 weeks gestation of pregnancy: Secondary | ICD-10-CM | POA: Insufficient documentation

## 2021-05-20 DIAGNOSIS — Z87891 Personal history of nicotine dependence: Secondary | ICD-10-CM | POA: Diagnosis not present

## 2021-05-20 DIAGNOSIS — O99213 Obesity complicating pregnancy, third trimester: Secondary | ICD-10-CM | POA: Insufficient documentation

## 2021-05-20 DIAGNOSIS — R102 Pelvic and perineal pain: Secondary | ICD-10-CM | POA: Diagnosis not present

## 2021-05-20 DIAGNOSIS — R109 Unspecified abdominal pain: Secondary | ICD-10-CM | POA: Diagnosis present

## 2021-05-20 DIAGNOSIS — O212 Late vomiting of pregnancy: Secondary | ICD-10-CM | POA: Diagnosis not present

## 2021-05-20 DIAGNOSIS — O99013 Anemia complicating pregnancy, third trimester: Secondary | ICD-10-CM | POA: Diagnosis not present

## 2021-05-20 DIAGNOSIS — O3483 Maternal care for other abnormalities of pelvic organs, third trimester: Secondary | ICD-10-CM | POA: Insufficient documentation

## 2021-05-20 DIAGNOSIS — O26893 Other specified pregnancy related conditions, third trimester: Secondary | ICD-10-CM | POA: Diagnosis not present

## 2021-05-20 DIAGNOSIS — Z6835 Body mass index (BMI) 35.0-35.9, adult: Secondary | ICD-10-CM | POA: Insufficient documentation

## 2021-05-20 HISTORY — DX: Unspecified asthma, uncomplicated: J45.909

## 2021-05-20 LAB — URINALYSIS, COMPLETE (UACMP) WITH MICROSCOPIC
Bilirubin Urine: NEGATIVE
Glucose, UA: NEGATIVE mg/dL
Hgb urine dipstick: NEGATIVE
Ketones, ur: NEGATIVE mg/dL
Leukocytes,Ua: NEGATIVE
Nitrite: NEGATIVE
Protein, ur: NEGATIVE mg/dL
Specific Gravity, Urine: 1.016 (ref 1.005–1.030)
pH: 6 (ref 5.0–8.0)

## 2021-05-20 LAB — CBC
HCT: 28.5 % — ABNORMAL LOW (ref 36.0–46.0)
Hemoglobin: 9.9 g/dL — ABNORMAL LOW (ref 12.0–15.0)
MCH: 30.7 pg (ref 26.0–34.0)
MCHC: 34.7 g/dL (ref 30.0–36.0)
MCV: 88.5 fL (ref 80.0–100.0)
Platelets: 244 10*3/uL (ref 150–400)
RBC: 3.22 MIL/uL — ABNORMAL LOW (ref 3.87–5.11)
RDW: 11.8 % (ref 11.5–15.5)
WBC: 10.6 10*3/uL — ABNORMAL HIGH (ref 4.0–10.5)
nRBC: 0 % (ref 0.0–0.2)

## 2021-05-20 LAB — WET PREP, GENITAL
Clue Cells Wet Prep HPF POC: NONE SEEN
Sperm: NONE SEEN
Trich, Wet Prep: NONE SEEN
WBC, Wet Prep HPF POC: <10 (ref <10)
Yeast Wet Prep HPF POC: NONE SEEN

## 2021-05-20 MED ORDER — ACETAMINOPHEN 325 MG PO TABS
650.0000 mg | ORAL_TABLET | ORAL | Status: DC | PRN
Start: 2021-05-20 — End: 2021-05-20
  Administered 2021-05-20: 650 mg via ORAL
  Filled 2021-05-20: qty 2

## 2021-05-20 MED ORDER — ONDANSETRON HCL 4 MG/2ML IJ SOLN: 4.0000 mg | Freq: Four times a day (QID) | INTRAMUSCULAR | Status: DC | PRN

## 2021-05-20 MED ORDER — SOD CITRATE-CITRIC ACID 500-334 MG/5ML PO SOLN
30.0000 mL | ORAL | Status: DC | PRN
Start: 2021-05-20 — End: 2021-05-20

## 2021-05-20 NOTE — OB Triage Note (Addendum)
Patient presented to L&D for evaluation with complaints of abdominal pain that started last night, states it comes and goes but she feels the pain is more localized to either side of her abdomen this morning whereas last night it was generalized abdominal pain. Patient states she has felt lightheaded off and on since last night. Tammie Meyer denies vaginal bleeding or leaking of fluid, reports normal fetal movement.  ?

## 2021-05-20 NOTE — Discharge Summary (Signed)
Patient ID: ?Tammie Meyer ?MRN: [id number] ?DOB/AGE: Oct 10, 2000 21 y.o. ? ?Admit date: 05/20/2021 ?Discharge date: 05/20/2021 ? ?Admission Diagnoses: 21yo G1P0 at 13w6dpresents with sharp lower abdominal pain this morning with N/V. ? ?Discharge Diagnoses: Round Ligament pain ? ?Factors complicating pregnancy: ?1. Left simple ovarian cyst ?2. Obesity in pregnancy - BMI 35 ?3. Tobacco use in pregnancy - stopped 02/2021 ?4. Anemia in pregnancy ? ?Prenatal Procedures: NST ? ?Consults: None ? ?Significant Diagnostic Studies:  ?Results for orders placed or performed during the hospital encounter of 05/20/21 (from the past 168 hour(s))  ?Wet prep, genital  ? Collection Time: 05/20/21 11:14 AM  ? Specimen: Vaginal  ?Result Value Ref Range  ? Yeast Wet Prep HPF POC NONE SEEN NONE SEEN  ? Trich, Wet Prep NONE SEEN NONE SEEN  ? Clue Cells Wet Prep HPF POC NONE SEEN NONE SEEN  ? WBC, Wet Prep HPF POC <10 <10  ? Sperm NONE SEEN   ?Urinalysis, Complete w Microscopic Urine, Clean Catch  ? Collection Time: 05/20/21 11:14 AM  ?Result Value Ref Range  ? Color, Urine YELLOW (A) YELLOW  ? APPearance HAZY (A) CLEAR  ? Specific Gravity, Urine 1.016 1.005 - 1.030  ? pH 6.0 5.0 - 8.0  ? Glucose, UA NEGATIVE NEGATIVE mg/dL  ? Hgb urine dipstick NEGATIVE NEGATIVE  ? Bilirubin Urine NEGATIVE NEGATIVE  ? Ketones, ur NEGATIVE NEGATIVE mg/dL  ? Protein, ur NEGATIVE NEGATIVE mg/dL  ? Nitrite NEGATIVE NEGATIVE  ? Leukocytes,Ua NEGATIVE NEGATIVE  ? RBC / HPF 0-5 0 - 5 RBC/hpf  ? WBC, UA 0-5 0 - 5 WBC/hpf  ? Bacteria, UA RARE (A) NONE SEEN  ? Squamous Epithelial / LPF 0-5 0 - 5  ? Mucus PRESENT   ? Ca Oxalate Crys, UA PRESENT   ?CBC  ? Collection Time: 05/20/21 11:27 AM  ?Result Value Ref Range  ? WBC 10.6 (H) 4.0 - 10.5 K/uL  ? RBC 3.22 (L) 3.87 - 5.11 MIL/uL  ? Hemoglobin 9.9 (L) 12.0 - 15.0 g/dL  ? HCT 28.5 (L) 36.0 - 46.0 %  ? MCV 88.5 80.0 - 100.0 fL  ? MCH 30.7 26.0 - 34.0 pg  ? MCHC 34.7 30.0 - 36.0 g/dL  ? RDW 11.8 11.5 - 15.5 %  ? Platelets  244 150 - 400 K/uL  ? nRBC 0.0 0.0 - 0.2 %  ? ? ?Treatments: Tylenol ? ?Hospital Course:  ?This is a 21y.o. G1P0 with IUP at 349w6deen for lower abdominal pain.  No leaking of fluid and no bleeding.  She was observed, fetal heart rate monitoring remained reassuring, and she had no signs/symptoms of preterm labor or other maternal-fetal concerns.  She was deemed stable for discharge to home with outpatient follow up. ? ?Discharge Physical Exam:  ?BP 118/65   Pulse 92   Temp 98.2 ?F (36.8 ?C) (Oral)   Resp 18   Ht '5\' 6"'$  (1.676 m)   Wt 105.7 kg   LMP 10/03/2020 (Approximate)   BMI 37.61 kg/m?  ? ?General: NAD ?CV: RRR ?Pulm: CTABL, nl effort ?ABD: s/nd/nt, gravid ?DVT Evaluation: LE non-ttp, no evidence of DVT on exam. ? ?NST: ?FHR baseline: 130 bpm ?Variability: moderate ?Accelerations: yes ?Decelerations: none ?Category/reactivity: reactive ? ?TOCO: quiet ?SVE: deferred  ?  ? ? ?Discharge Condition: Stable ? ?Disposition:  ?Discharge disposition: 01-Home or Self Care ? ? ? ? ? ? ? ?Allergies as of 05/20/2021   ?No Known Allergies ?  ? ?  ?Medication List  ?  ? ?  TAKE these medications   ? ?acetaminophen 500 MG tablet ?Commonly known as: TYLENOL ?Take 2 tablets (1,000 mg total) by mouth every 6 (six) hours as needed (for pain scale < 4  OR  temperature  >/=  100.5 F). ?  ?multivitamin-prenatal 27-0.8 MG Tabs tablet ?Take 1 tablet by mouth daily at 12 noon. ?  ?simethicone 80 MG chewable tablet ?Commonly known as: MYLICON ?Chew 1 tablet (80 mg total) by mouth 4 (four) times daily as needed for flatulence. ?  ? ?  ? ? ? ?Signed: ? ?Linda Hedges, CNM ?05/20/2021 1:12 PM ? ?  ?

## 2021-06-27 LAB — OB RESULTS CONSOLE RPR: RPR: NONREACTIVE

## 2021-06-27 LAB — OB RESULTS CONSOLE GBS: GBS: NEGATIVE

## 2021-06-27 LAB — OB RESULTS CONSOLE GC/CHLAMYDIA
Chlamydia: NEGATIVE
Neisseria Gonorrhea: NEGATIVE

## 2021-06-27 LAB — OB RESULTS CONSOLE HIV ANTIBODY (ROUTINE TESTING): HIV: NONREACTIVE

## 2021-07-10 ENCOUNTER — Observation Stay
Admission: EM | Admit: 2021-07-10 | Discharge: 2021-07-11 | Disposition: A | Discharge status: Home / Self Care | Payer: Medicaid Other | Source: Home / Self Care | Admitting: Obstetrics and Gynecology

## 2021-07-10 ENCOUNTER — Encounter: Payer: Self-pay | Admitting: Obstetrics and Gynecology

## 2021-07-10 ENCOUNTER — Other Ambulatory Visit: Payer: Self-pay

## 2021-07-10 DIAGNOSIS — O471 False labor at or after 37 completed weeks of gestation: Secondary | ICD-10-CM | POA: Insufficient documentation

## 2021-07-10 DIAGNOSIS — O4703 False labor before 37 completed weeks of gestation, third trimester: Secondary | ICD-10-CM | POA: Diagnosis present

## 2021-07-10 DIAGNOSIS — O480 Post-term pregnancy: Secondary | ICD-10-CM | POA: Insufficient documentation

## 2021-07-10 DIAGNOSIS — Z3A40 40 weeks gestation of pregnancy: Secondary | ICD-10-CM | POA: Insufficient documentation

## 2021-07-10 DIAGNOSIS — O99513 Diseases of the respiratory system complicating pregnancy, third trimester: Secondary | ICD-10-CM | POA: Insufficient documentation

## 2021-07-10 DIAGNOSIS — Z87891 Personal history of nicotine dependence: Secondary | ICD-10-CM | POA: Insufficient documentation

## 2021-07-10 DIAGNOSIS — J45909 Unspecified asthma, uncomplicated: Secondary | ICD-10-CM | POA: Insufficient documentation

## 2021-07-10 MED ORDER — ACETAMINOPHEN 500 MG PO TABS
ORAL_TABLET | ORAL | Status: AC
  Filled 2021-07-10: qty 2

## 2021-07-10 MED ORDER — ACETAMINOPHEN 500 MG PO TABS: 1000.0000 mg | ORAL_TABLET | Freq: Four times a day (QID) | ORAL | Status: DC | PRN

## 2021-07-10 MED ORDER — ACETAMINOPHEN 500 MG PO TABS
1000.0000 mg | ORAL_TABLET | Freq: Four times a day (QID) | ORAL | Status: DC | PRN
  Administered 2021-07-10: 1000 mg via ORAL

## 2021-07-10 MED ORDER — ZOLPIDEM TARTRATE 5 MG PO TABS: 5.0000 mg | ORAL_TABLET | Freq: Every evening | ORAL | Status: DC | PRN

## 2021-07-11 ENCOUNTER — Inpatient Hospital Stay: Payer: Medicaid Other | Admitting: Anesthesiology

## 2021-07-11 ENCOUNTER — Encounter: Payer: Self-pay | Admitting: Obstetrics and Gynecology

## 2021-07-11 ENCOUNTER — Other Ambulatory Visit: Payer: Self-pay

## 2021-07-11 ENCOUNTER — Inpatient Hospital Stay
Admission: EM | Admit: 2021-07-11 | Discharge: 2021-07-14 | DRG: 806 | Disposition: A | Discharge status: Home / Self Care | Payer: Medicaid Other | Attending: Certified Nurse Midwife | Admitting: Certified Nurse Midwife

## 2021-07-11 ENCOUNTER — Other Ambulatory Visit: Payer: Self-pay | Admitting: Certified Nurse Midwife

## 2021-07-11 DIAGNOSIS — O480 Post-term pregnancy: Principal | ICD-10-CM | POA: Diagnosis present

## 2021-07-11 DIAGNOSIS — D62 Acute posthemorrhagic anemia: Secondary | ICD-10-CM | POA: Diagnosis not present

## 2021-07-11 DIAGNOSIS — Z3490 Encounter for supervision of normal pregnancy, unspecified, unspecified trimester: Secondary | ICD-10-CM

## 2021-07-11 DIAGNOSIS — O9081 Anemia of the puerperium: Secondary | ICD-10-CM | POA: Diagnosis not present

## 2021-07-11 DIAGNOSIS — Z87891 Personal history of nicotine dependence: Secondary | ICD-10-CM

## 2021-07-11 DIAGNOSIS — Z3A40 40 weeks gestation of pregnancy: Secondary | ICD-10-CM | POA: Diagnosis not present

## 2021-07-11 DIAGNOSIS — O99214 Obesity complicating childbirth: Secondary | ICD-10-CM | POA: Diagnosis present

## 2021-07-11 LAB — TYPE AND SCREEN
ABO/RH(D): A POS
Antibody Screen: NEGATIVE

## 2021-07-11 LAB — COMPREHENSIVE METABOLIC PANEL
ALT: 8 U/L (ref 0–44)
AST: 13 U/L — ABNORMAL LOW (ref 15–41)
Albumin: 2.7 g/dL — ABNORMAL LOW (ref 3.5–5.0)
Alkaline Phosphatase: 204 U/L — ABNORMAL HIGH (ref 38–126)
Anion gap: 7 (ref 5–15)
BUN: 7 mg/dL (ref 6–20)
CO2: 18 mmol/L — ABNORMAL LOW (ref 22–32)
Calcium: 8.6 mg/dL — ABNORMAL LOW (ref 8.9–10.3)
Chloride: 110 mmol/L (ref 98–111)
Creatinine, Ser: 0.36 mg/dL — ABNORMAL LOW (ref 0.44–1.00)
GFR, Estimated: >60 mL/min (ref >60)
Glucose, Bld: 124 mg/dL — ABNORMAL HIGH (ref 70–99)
Potassium: 3.5 mmol/L (ref 3.5–5.1)
Sodium: 135 mmol/L (ref 135–145)
Total Bilirubin: 0.2 mg/dL — ABNORMAL LOW (ref 0.3–1.2)
Total Protein: 6.4 g/dL — ABNORMAL LOW (ref 6.5–8.1)

## 2021-07-11 LAB — CBC
HCT: 34.6 % — ABNORMAL LOW (ref 36.0–46.0)
Hemoglobin: 11.9 g/dL — ABNORMAL LOW (ref 12.0–15.0)
MCH: 29.2 pg (ref 26.0–34.0)
MCHC: 34.4 g/dL (ref 30.0–36.0)
MCV: 85 fL (ref 80.0–100.0)
Platelets: 247 10*3/uL (ref 150–400)
RBC: 4.07 MIL/uL (ref 3.87–5.11)
RDW: 12.6 % (ref 11.5–15.5)
WBC: 8.1 10*3/uL (ref 4.0–10.5)
nRBC: 0 % (ref 0.0–0.2)

## 2021-07-11 LAB — PROTEIN / CREATININE RATIO, URINE
Creatinine, Urine: 102 mg/dL
Protein Creatinine Ratio: 0.25 mg/mg{Cre} — ABNORMAL HIGH (ref 0.00–0.15)
Total Protein, Urine: 26 mg/dL

## 2021-07-11 MED ORDER — LACTATED RINGERS IV SOLN: INTRAVENOUS | Status: DC

## 2021-07-11 MED ORDER — HYDROXYZINE HCL 50 MG PO TABS
50.0000 mg | ORAL_TABLET | Freq: Four times a day (QID) | ORAL | Status: DC | PRN
  Administered 2021-07-11: 50 mg via ORAL
  Filled 2021-07-11 (×2): qty 1

## 2021-07-11 MED ORDER — MISOPROSTOL 50MCG HALF TABLET: 50.0000 ug | ORAL_TABLET | ORAL | Status: DC | PRN

## 2021-07-11 MED ORDER — MISOPROSTOL 25 MCG QUARTER TABLET
25.0000 ug | ORAL_TABLET | ORAL | Status: AC
  Administered 2021-07-11: 25 ug via VAGINAL
  Filled 2021-07-11: qty 1

## 2021-07-11 MED ORDER — ACETAMINOPHEN 500 MG PO TABS: 1000.0000 mg | ORAL_TABLET | Freq: Four times a day (QID) | ORAL | Status: DC | PRN

## 2021-07-11 MED ORDER — FENTANYL-BUPIVACAINE-NACL 0.5-0.125-0.9 MG/250ML-% EP SOLN
EPIDURAL | Status: AC
  Filled 2021-07-11: qty 250

## 2021-07-11 MED ORDER — OXYTOCIN-SODIUM CHLORIDE 30-0.9 UT/500ML-% IV SOLN
1.0000 m[IU]/min | INTRAVENOUS | Status: DC
  Administered 2021-07-11: 2 m[IU]/min via INTRAVENOUS

## 2021-07-11 MED ORDER — FENTANYL-BUPIVACAINE-NACL 0.5-0.125-0.9 MG/250ML-% EP SOLN
EPIDURAL | Status: DC | PRN
  Administered 2021-07-11: 12 mL/h via EPIDURAL

## 2021-07-11 MED ORDER — OXYTOCIN BOLUS FROM INFUSION
333.0000 mL | Freq: Once | INTRAVENOUS | Status: AC
  Administered 2021-07-12: 333 mL via INTRAVENOUS

## 2021-07-11 MED ORDER — ZOLPIDEM TARTRATE 5 MG PO TABS
ORAL_TABLET | ORAL | Status: AC
  Administered 2021-07-11: 5 mg
  Filled 2021-07-11: qty 1

## 2021-07-11 MED ORDER — TERBUTALINE SULFATE 1 MG/ML IJ SOLN: 0.2500 mg | Freq: Once | INTRAMUSCULAR | Status: DC | PRN

## 2021-07-11 MED ORDER — LIDOCAINE HCL (PF) 1 % IJ SOLN
INTRAMUSCULAR | Status: DC | PRN
  Administered 2021-07-11: 3 mL via SUBCUTANEOUS
  Administered 2021-07-11 (×2): 2 mL via SUBCUTANEOUS

## 2021-07-11 MED ORDER — ONDANSETRON HCL 4 MG/2ML IJ SOLN: 4.0000 mg | Freq: Four times a day (QID) | INTRAMUSCULAR | Status: DC | PRN

## 2021-07-11 MED ORDER — LACTATED RINGERS IV BOLUS
1000.0000 mL | Freq: Once | INTRAVENOUS | Status: AC
  Administered 2021-07-11: 1000 mL via INTRAVENOUS

## 2021-07-11 MED ORDER — BUPIVACAINE HCL (PF) 0.25 % IJ SOLN
INTRAMUSCULAR | Status: DC | PRN
  Administered 2021-07-11: 5 mL via EPIDURAL
  Administered 2021-07-11: 3 mL via EPIDURAL

## 2021-07-11 MED ORDER — ACETAMINOPHEN 325 MG PO TABS: 650.0000 mg | ORAL_TABLET | ORAL | Status: DC | PRN

## 2021-07-11 MED ORDER — LACTATED RINGERS IV SOLN
500.0000 mL | INTRAVENOUS | Status: DC | PRN
  Administered 2021-07-11: 500 mL via INTRAVENOUS

## 2021-07-11 MED ORDER — AMMONIA AROMATIC IN INHA
RESPIRATORY_TRACT | Status: AC
  Filled 2021-07-11: qty 10

## 2021-07-11 MED ORDER — LIDOCAINE HCL (PF) 1 % IJ SOLN: 30.0000 mL | INTRAMUSCULAR | Status: DC | PRN

## 2021-07-11 MED ORDER — LIDOCAINE HCL (PF) 1 % IJ SOLN
INTRAMUSCULAR | Status: AC
  Filled 2021-07-11: qty 30

## 2021-07-11 MED ORDER — MISOPROSTOL 200 MCG PO TABS
ORAL_TABLET | ORAL | Status: AC
  Filled 2021-07-11: qty 4

## 2021-07-11 MED ORDER — MISOPROSTOL 25 MCG QUARTER TABLET
25.0000 ug | ORAL_TABLET | ORAL | Status: DC
  Filled 2021-07-11: qty 1

## 2021-07-11 MED ORDER — SOD CITRATE-CITRIC ACID 500-334 MG/5ML PO SOLN
30.0000 mL | ORAL | Status: DC | PRN
Start: 2021-07-11 — End: 2021-07-12

## 2021-07-11 MED ORDER — MISOPROSTOL 25 MCG QUARTER TABLET
ORAL_TABLET | ORAL | Status: AC
  Administered 2021-07-11: 25 ug via BUCCAL
  Filled 2021-07-11: qty 2

## 2021-07-11 MED ORDER — OXYTOCIN-SODIUM CHLORIDE 30-0.9 UT/500ML-% IV SOLN
2.5000 [IU]/h | INTRAVENOUS | Status: DC
  Administered 2021-07-12: 2.5 [IU]/h via INTRAVENOUS
  Filled 2021-07-11: qty 500

## 2021-07-11 MED ORDER — OXYTOCIN 10 UNIT/ML IJ SOLN
INTRAMUSCULAR | Status: AC
  Filled 2021-07-11: qty 2

## 2021-07-11 MED ORDER — LIDOCAINE-EPINEPHRINE (PF) 1.5 %-1:200000 IJ SOLN
INTRAMUSCULAR | Status: DC | PRN
  Administered 2021-07-11: 4 mL via EPIDURAL

## 2021-07-11 NOTE — H&P (Signed)
OB History & Physical   History of Present Illness:  Chief Complaint:   HPI:  Tammie Meyer is a 21 y.o. G1P0 female at 24w2ddated by LMP.  She presents to L&D for IOL for post dates  She reports:  -active fetal movement -no leakage of fluid -no vaginal bleeding -no contractions  Pregnancy Issues: Left simple ovarian cyst 02/19/21: surgically removed d/t acute pelvic pain Obesity in pregnancy - BMI 35 Tobacco use in pregnancy  Anemia in pregnancy   Maternal Medical History:   Past Medical History:  Diagnosis Date   Asthma    Sleep-disordered breathing    Tonsillar hypertrophy     Past Surgical History:  Procedure Laterality Date   GUM SURGERY     LAPAROSCOPIC OVARIAN CYSTECTOMY Right 02/19/2021   Procedure: LAPAROSCOPIC OVARIAN CYSTECTOMY;  Surgeon: BBenjaman Kindler MD;  Location: [facility] ORS;  Service: Gynecology;  Laterality: Right;    No Known Allergies  Prior to Admission medications   Medication Sig Start Date End Date Taking? Authorizing Provider  acetaminophen (TYLENOL) 500 MG tablet Take 2 tablets (1,000 mg total) by mouth every 6 (six) hours as needed (for pain scale < 4  OR  temperature  >/=  100.5 F). 02/20/21  Yes Wilson, DGeronimo Running CNM  Prenatal Vit-Fe Fumarate-FA (MULTIVITAMIN-PRENATAL) 27-0.8 MG TABS tablet Take 1 tablet by mouth daily at 12 noon.   Yes [provider]     [facility] [place]  reports that she has quit smoking. Her smoking use included cigarettes. She has been exposed to tobacco smoke. She has never used smokeless tobacco. She reports that she does not drink alcohol and does not use drugs.  Family History: family history is not on file.   Review of Systems: A full review of systems was performed and negative except as noted in the HPI.    Physical Exam:  Vital Signs: BP 119/65 (BP Location: Right Arm)   Pulse 81   Temp 97.8 F (36.6 C) (Oral)   Resp 16   Ht '5\' 6"'$   (1.676 m)   Wt 113 kg   LMP 10/03/2020 (Approximate)   SpO2 97%   BMI 40.21 kg/m   General:   alert and cooperative  Skin:  normal  Neurologic:    Alert & oriented x 3  Lungs:    Nl effort  Heart:   regular rate and rhythm  Abdomen:  soft, non-tender; bowel sounds normal; no masses,  no organomegaly  Extremities: : non-tender, symmetric, no edema bilaterally.       Pertinent Results:  Prenatal Labs: Blood type/Rh A pos  Antibody screen neg  Rubella Immune  Varicella Immune  RPR NR  HBsAg Neg  HIV NR  GC neg  Chlamydia neg  Genetic screening declined  1 hour GTT 116  3 hour GTT   GBS Neg   FHT: FHR: 140 bpm, variability: moderate,  accelerations:  Present,  decelerations:  Absent Category/reactivity:  Category I TOCO: none SVE: Dilation: 3 / Effacement (%): 90 / Station: -1     Assessment:  Tammie Meyer a 21y.o. G1P0 female at 478w2dith postdate pregnancy.   Plan:  1. Admit to Labor & Delivery; consents reviewed and obtained  2. Fetal Well being  - Fetal Tracing: Cat I - GBS neg - Presentation: vtx confirmed by sve   3. Routine OB: - Prenatal labs reviewed, as above - Rh pos - CBC &  T&S on admit - Clear fluids, IVF  4. Induction of Labor -  Contractions by external toco in place -  Plan for induction with cytotec and pitocin -  Plan for continuous fetal monitoring  -  Maternal pain control as desired: IVPM, nitrous, regional anesthesia - Anticipate vaginal delivery  5. Post Partum Planning: - Infant feeding: Breastfeeding - Contraception: considering IUD - Liletta  - Tdap: should have been done at the HD  Linda Hedges, CNM 07/11/2021 7:47 PM

## 2021-07-11 NOTE — Progress Notes (Signed)
Labor Progress Note  Tammie Meyer is a 21 y.o. G1P0 at 48w2dby LMP admitted for induction of labor due to Post dates. Due date 07/09/21.  Subjective: Pt is feeling better with epidural and coping with anxiety better  Objective: BP (!) 149/80   Pulse 81   Temp 97.8 F (36.6 C) (Oral)   Resp 16   Ht '5\' 6"'$  (1.676 m)   Wt 113 kg   LMP 10/03/2020 (Approximate)   SpO2 97%   BMI 40.21 kg/m    Fetal Assessment: FHT:  FHR: 125 bpm, variability: moderate,  accelerations:  Present,  decelerations:  Absent Category/reactivity:  Category I UC:   every 1-5 min SVE:    Dilation: 3cm  Effacement: 90%  Station:  -1  Consistency: soft  Position: anterior  Membrane status:Intact Amniotic color: n/a  Labs: Lab Results  Component Value Date   WBC 8.1 07/11/2021   HGB 11.9 (L) 07/11/2021   HCT 34.6 (L) 07/11/2021   MCV 85.0 07/11/2021   PLT 247 07/11/2021    Assessment / Plan: Induction of labor due to postterm,  progressing well on cytotec Will start Pitocin  Labor: Progressing normally Preeclampsia:   149/80 Fetal Wellbeing:  Category I Pain Control:  Epidural I/D:   Afebrile, Intact, GBS neg Anticipated MOD:  NSVD  JClydene Laming CNM 07/11/2021, 7:54 PM

## 2021-07-11 NOTE — Anesthesia Preprocedure Evaluation (Signed)
Anesthesia Evaluation  Patient identified by MRN, date of birth, ID band Patient awake    Reviewed: Allergy & Precautions, H&P , NPO status , Patient's Chart, lab work & pertinent test results  Airway Mallampati: II  TM Distance: >3 FB     Dental no notable dental hx. (+) Teeth Intact   Pulmonary asthma , former smoker,           Cardiovascular negative cardio ROS       Neuro/Psych negative neurological ROS  negative psych ROS   GI/Hepatic Neg liver ROS, GERD  ,  Endo/Other  negative endocrine ROS  Renal/GU negative Renal ROS  negative genitourinary   Musculoskeletal   Abdominal   Peds  Hematology negative hematology ROS (+)   Anesthesia Other Findings   Reproductive/Obstetrics (+) Pregnancy                             Anesthesia Physical Anesthesia Plan  ASA: 2  Anesthesia Plan: Epidural   Post-op Pain Management:    Induction:   PONV Risk Score and Plan:   Airway Management Planned:   Additional Equipment:   Intra-op Plan:   Post-operative Plan:   Informed Consent: I have reviewed the patients History and Physical, chart, labs and discussed the procedure including the risks, benefits and alternatives for the proposed anesthesia with the patient or authorized representative who has indicated his/her understanding and acceptance.       Plan Discussed with: CRNA and Anesthesiologist  Anesthesia Plan Comments:         Anesthesia Quick Evaluation

## 2021-07-11 NOTE — Discharge Summary (Signed)
Tammie Meyer is a 21 y.o. female. She is at [redacted]w[redacted]d gestation. Patient's last menstrual period was 10/03/2020 (approximate). Estimated Date of Delivery: 07/09/21  [facility]  Chief complaint: uterine contractions  HPI: Tammie Meyer presents to L&D with complaints of uterine contractions  Factors complicating pregnancy: Obesity Tobacco smoker Anemia Left ovarian simple cyst  S: Resting comfortably. no VB.no LOF,  Active fetal movement.   Maternal Medical History:  Past Medical Hx:  has a past medical history of Asthma, Sleep-disordered breathing, and Tonsillar hypertrophy.    Past Surgical Hx:  has a past surgical history that includes Gum surgery and Laparoscopic ovarian cystectomy (Right, 02/19/2021).   No Known Allergies   Prior to Admission medications   Medication Sig Start Date End Date Taking? Authorizing Provider  acetaminophen (TYLENOL) 500 MG tablet Take 2 tablets (1,000 mg total) by mouth every 6 (six) hours as needed (for pain scale < 4  OR  temperature  >/=  100.5 F). Patient not taking: Reported on 04/05/2021 02/20/21   Janyce Llanos, CNM  Prenatal Vit-Fe Fumarate-FA (MULTIVITAMIN-PRENATAL) 27-0.8 MG TABS tablet Take 1 tablet by mouth daily at 12 noon.    [provider]  simethicone (MYLICON) 80 MG chewable tablet Chew 1 tablet (80 mg total) by mouth 4 (four) times daily as needed for flatulence. Patient not taking: Reported on 04/05/2021 02/20/21   Janyce Llanos, CNM    Social History: She  reports that she has quit smoking. Her smoking use included cigarettes. She has been exposed to tobacco smoke. She has never used smokeless tobacco. She reports that she does not drink alcohol and does not use drugs.  Family History: family history is not on file. ,no history of gyn cancers  Review of Systems: A full review of systems was performed and negative except as noted in the HPI.    O:  BP 137/72 (BP Location: Right Arm)    Pulse (!) 103   Temp 98.3 F (36.8 C) (Oral)   Resp 20   Ht 5\' 6"  (1.676 m)   Wt 112.5 kg   LMP 10/03/2020 (Approximate)   BMI 40.03 kg/m  No results found for this or any previous visit (from the past 48 hour(s)).   Constitutional: NAD, AAOx3  HE/ENT: extraocular movements grossly intact, moist mucous membranes CV: RRR PULM: nl respiratory effort Abd: gravid, non-tender, non-distended, soft  Ext: Non-tender, Nonedmeatous Psych: mood appropriate, speech normal Pelvic : deferred SVE: Dilation: 1.5 Effacement (%): 70 Station: -2, -1 Exam by:: Katharina Caper CNM   Fetal Monitor: Baseline: 145 bpm Variability: moderate Accels: Present Decels: none Toco: irregular cramping  Category: I NST: 1   Assessment: 21 y.o. [redacted]w[redacted]d here for antenatal surveillance during pregnancy.  Principle diagnosis: Uterine contractions   Plan: Labor evaluation for 2 hours with no cervical change Labor: not present.  NST reviewed  Fetal Wellbeing: Reassuring Cat 1 tracing. Reactive NST  IV fluids  Ambien given for therapeutic rest D/c home stable, precautions reviewed, follow-up as scheduled.  IOL scheduled for tomorrow 07/11/21  ----- Chari Manning, CNM Certified Nurse Midwife [facility]

## 2021-07-11 NOTE — Progress Notes (Signed)
Labor Progress Note  Tammie Meyer is a 21 y.o. G1P0 at 63w2dby LMP admitted for induction of labor due to Post dates. Due date 07/09/21.  Subjective: Very anxious, having difficulty coping with pain.  Objective: BP 119/65 (BP Location: Right Arm)   Pulse 81   Temp 97.8 F (36.6 C) (Oral)   Resp 16   Ht '5\' 6"'$  (1.676 m)   Wt 113 kg   LMP 10/03/2020 (Approximate)   SpO2 97%   BMI 40.21 kg/m    Fetal Assessment: FHT:  FHR: 135 bpm, variability: moderate,  accelerations:  Present,  decelerations:  Absent Category/reactivity:  Category I UC:   irregular SVE:    Dilation: 1cm  Effacement: 60-70%  Station:  -1  Consistency: soft  Position: middle  Membrane status:Intact Amniotic color: n/a  Labs: Lab Results  Component Value Date   WBC 8.1 07/11/2021   HGB 11.9 (L) 07/11/2021   HCT 34.6 (L) 07/11/2021   MCV 85.0 07/11/2021   PLT 247 07/11/2021    Assessment / Plan: Induction of labor due to postterm,  progressing well on cytotec  Labor: Progressing normally Preeclampsia:   137/92 Fetal Wellbeing:  Category I Pain Control:  Labor support without medications I/D:   Afebrile, Intact, GBS neg Anticipated MOD:  NSVD  JClydene Laming CNM 07/11/2021, 7:48 PM

## 2021-07-11 NOTE — Progress Notes (Signed)
Labor Progress Note  Tammie Meyer is a 21 y.o. G1P0 at 74w2dby LMP admitted for induction of labor due to Post dates. Due date 07/09/21.  Subjective: AROM'd  Objective: BP 107/60   Pulse 80   Temp 98.2 F (36.8 C) (Oral)   Resp 16   Ht '5\' 6"'$  (1.676 m)   Wt 113 kg   LMP 10/03/2020 (Approximate)   SpO2 98%   BMI 40.21 kg/m    Fetal Assessment: FHT:  FHR: 135 bpm, variability: moderate,  accelerations:  Present,  decelerations:  Present occasional variables Category/reactivity:  Category I UC:   every 1-2 min SVE:    Dilation: 3.5cm  Effacement: 90%  Station:  -1  Consistency: soft  Position: anterior  Membrane status:SROM'd Amniotic color: clear  Labs: Lab Results  Component Value Date   WBC 8.1 07/11/2021   HGB 11.9 (L) 07/11/2021   HCT 34.6 (L) 07/11/2021   MCV 85.0 07/11/2021   PLT 247 07/11/2021    Assessment / Plan: Induction of labor due to postterm,  progressing well on pitocin  Labor: Progressing normally Preeclampsia:   107/60 Fetal Wellbeing:  Category I Pain Control:  Epidural I/D:   Afebrile, SROM x 0 hrs, GBS neg Anticipated MOD:  NSVD  JClydene Laming CNM 07/11/2021, 9:10 PM

## 2021-07-11 NOTE — Progress Notes (Signed)
Pt arrived for scheduled IOL with support people.

## 2021-07-11 NOTE — Anesthesia Procedure Notes (Addendum)
Epidural Patient location during procedure: OB Start time: 07/11/2021 6:23 PM End time: 07/11/2021 6:30 PM  Staffing Anesthesiologist: Molli Barrows, MD Resident/CRNA: Aline Brochure, CRNA Performed: resident/CRNA and anesthesiologist   Preanesthetic Checklist Completed: patient identified, IV checked, site marked, risks and benefits discussed, surgical consent, monitors and equipment checked, pre-op evaluation and timeout performed  Epidural Patient position: sitting Prep: Betadine Patient monitoring: heart rate, continuous pulse ox and blood pressure Approach: midline Location: L3-L4 Injection technique: LOR saline  Needle:  Needle type: Tuohy  Needle gauge: 17 G Needle length: 9 cm and 9 Needle insertion depth: 8 cm Catheter type: closed end flexible Catheter size: 19 Gauge Catheter at skin depth: 13 cm Test dose: negative and 1.5% lidocaine with Epi 1:200 K  Assessment Sensory level: T10 Events: blood not aspirated, injection not painful, no injection resistance, no paresthesia and negative IV test  Additional Notes 3 attempt Pt. Evaluated and documentation done after procedure finished. Patient identified. Risks/Benefits/Options discussed with patient including but not limited to bleeding, infection, nerve damage, paralysis, failed block, incomplete pain control, headache, blood pressure changes, nausea, vomiting, reactions to medication both or allergic, itching and postpartum back pain. Confirmed with bedside nurse the patient's most recent platelet count. Confirmed with patient that they are not currently taking any anticoagulation, have any bleeding history or any family history of bleeding disorders. Patient expressed understanding and wished to proceed. All questions were answered. Sterile technique was used throughout the entire procedure. Please see nursing notes for vital signs. Test dose was given through epidural catheter and negative prior to continuing to  dose epidural or start infusion. Warning signs of high block given to the patient including shortness of breath, tingling/numbness in hands, complete motor block, or any concerning symptoms with instructions to call for help. Patient was given instructions on fall risk and not to get out of bed. All questions and concerns addressed with instructions to call with any issues or inadequate analgesia. On 3rd attempt, CSF return. Moved to a new level, with successful placement.   Patient tolerated the insertion well without immediate complications.Reason for block:procedure for pain

## 2021-07-12 ENCOUNTER — Encounter: Payer: Self-pay | Admitting: Obstetrics and Gynecology

## 2021-07-12 LAB — CBC
HCT: 25.7 % — ABNORMAL LOW (ref 36.0–46.0)
Hemoglobin: 8.7 g/dL — ABNORMAL LOW (ref 12.0–15.0)
MCH: 28.9 pg (ref 26.0–34.0)
MCHC: 33.9 g/dL (ref 30.0–36.0)
MCV: 85.4 fL (ref 80.0–100.0)
Platelets: 221 10*3/uL (ref 150–400)
RBC: 3.01 MIL/uL — ABNORMAL LOW (ref 3.87–5.11)
RDW: 12.6 % (ref 11.5–15.5)
WBC: 11.3 10*3/uL — ABNORMAL HIGH (ref 4.0–10.5)
nRBC: 0 % (ref 0.0–0.2)

## 2021-07-12 LAB — RPR: RPR Ser Ql: NONREACTIVE

## 2021-07-12 MED ORDER — MISOPROSTOL 25 MCG QUARTER TABLET: 50.0000 ug | ORAL_TABLET | ORAL | Status: DC

## 2021-07-12 MED ORDER — WITCH HAZEL-GLYCERIN EX PADS
1.0000 | MEDICATED_PAD | CUTANEOUS | Status: DC | PRN
  Administered 2021-07-12: 1 via TOPICAL
  Filled 2021-07-12 (×3): qty 100

## 2021-07-12 MED ORDER — ONDANSETRON HCL 4 MG/2ML IJ SOLN: 4.0000 mg | INTRAMUSCULAR | Status: DC | PRN

## 2021-07-12 MED ORDER — TETANUS-DIPHTH-ACELL PERTUSSIS 5-2.5-18.5 LF-MCG/0.5 IM SUSY
0.5000 mL | PREFILLED_SYRINGE | Freq: Once | INTRAMUSCULAR | Status: DC
  Filled 2021-07-12: qty 0.5

## 2021-07-12 MED ORDER — ACETAMINOPHEN 325 MG PO TABS
650.0000 mg | ORAL_TABLET | ORAL | Status: DC | PRN
  Administered 2021-07-12 – 2021-07-14 (×7): 650 mg via ORAL
  Filled 2021-07-12 (×7): qty 2

## 2021-07-12 MED ORDER — FERROUS SULFATE 325 (65 FE) MG PO TABS
325.0000 mg | ORAL_TABLET | Freq: Two times a day (BID) | ORAL | Status: DC
  Administered 2021-07-12 – 2021-07-14 (×4): 325 mg via ORAL
  Filled 2021-07-12 (×4): qty 1

## 2021-07-12 MED ORDER — DIPHENHYDRAMINE HCL 25 MG PO CAPS: 25.0000 mg | ORAL_CAPSULE | Freq: Four times a day (QID) | ORAL | Status: DC | PRN

## 2021-07-12 MED ORDER — BENZOCAINE-MENTHOL 20-0.5 % EX AERO
1.0000 | INHALATION_SPRAY | CUTANEOUS | Status: DC | PRN
  Administered 2021-07-12: 1 via TOPICAL
  Filled 2021-07-12 (×2): qty 56

## 2021-07-12 MED ORDER — OXYCODONE HCL 5 MG PO TABS
5.0000 mg | ORAL_TABLET | ORAL | Status: DC | PRN
  Administered 2021-07-12 – 2021-07-13 (×7): 5 mg via ORAL
  Filled 2021-07-12 (×7): qty 1

## 2021-07-12 MED ORDER — DOCUSATE SODIUM 100 MG PO CAPS
100.0000 mg | ORAL_CAPSULE | Freq: Two times a day (BID) | ORAL | Status: DC
  Administered 2021-07-13 – 2021-07-14 (×3): 100 mg via ORAL
  Filled 2021-07-12 (×4): qty 1

## 2021-07-12 MED ORDER — OXYCODONE HCL 5 MG PO TABS
10.0000 mg | ORAL_TABLET | ORAL | Status: DC | PRN
  Administered 2021-07-14 (×3): 10 mg via ORAL
  Filled 2021-07-12 (×3): qty 2

## 2021-07-12 MED ORDER — COCONUT OIL OIL: 1.0000 | TOPICAL_OIL | Status: DC | PRN

## 2021-07-12 MED ORDER — PRENATAL MULTIVITAMIN CH
1.0000 | ORAL_TABLET | Freq: Every day | ORAL | Status: DC
  Administered 2021-07-12 – 2021-07-14 (×3): 1 via ORAL
  Filled 2021-07-12 (×3): qty 1

## 2021-07-12 MED ORDER — IBUPROFEN 600 MG PO TABS
600.0000 mg | ORAL_TABLET | Freq: Four times a day (QID) | ORAL | Status: DC
  Administered 2021-07-12 – 2021-07-13 (×6): 600 mg via ORAL
  Filled 2021-07-12 (×6): qty 1

## 2021-07-12 MED ORDER — ONDANSETRON HCL 4 MG PO TABS: 4.0000 mg | ORAL_TABLET | ORAL | Status: DC | PRN

## 2021-07-12 MED ORDER — DIBUCAINE (PERIANAL) 1 % EX OINT
1.0000 | TOPICAL_OINTMENT | CUTANEOUS | Status: DC | PRN
  Administered 2021-07-12: 1 via RECTAL
  Filled 2021-07-12 (×2): qty 28

## 2021-07-12 MED ORDER — SODIUM CHLORIDE 0.9 % IV SOLN
100.0000 mg | Freq: Once | INTRAVENOUS | Status: AC
  Administered 2021-07-12: 100 mg via INTRAVENOUS
  Filled 2021-07-12: qty 5

## 2021-07-12 MED ORDER — SIMETHICONE 80 MG PO CHEW
80.0000 mg | CHEWABLE_TABLET | ORAL | Status: DC | PRN
  Administered 2021-07-12: 80 mg via ORAL
  Filled 2021-07-12: qty 1

## 2021-07-12 NOTE — Lactation Note (Signed)
This note was copied from a baby's chart. Lactation Consultation Note  Patient Name: Tammie Meyer [id number] Date: 07/12/2021 Reason for consult: Initial assessment Age:20 hours  Maternal Data Does the patient have breastfeeding experience prior to this delivery?: No  Feeding Mother's Current Feeding Choice: Formula Nipple Type: Slow - flow Mom called me to assist with feeding as I had instructed her earlier to call me when she was ready to breastfeed at next feeding, (she had visitors in room)  When I got to room, mom had already added 15 cc formula(Similac) to a gradufeed and stated that she would like to formula feed this feeding, she is unsure of how she would like to feed baby after this, I assured her that I would assist her if she would like to try breastfeeding or pumping after this.  Baby uncoordinated with suck on bottle, mom assured that this was normal and it would take time for baby to coordinate suck with each feeding.          LATCH Score Latch:  (no breastfeeding observed, mom would like to formula feed)                  Lactation Tools Discussed/Used  LC name and no written on white board at previous visit   Interventions Interventions: Education  Discharge [facility] Program: Yes  Consult Status Consult Status: PRN    Dyann Kief 07/12/2021, 12:00 PM

## 2021-07-12 NOTE — Discharge Summary (Signed)
Obstetrical Discharge Summary  Patient Name: Tammie Meyer DOB: 2000/06/20 MRN: [id number]  Date of Admission: 07/11/2021 Date of Delivery: 07/12/21 Delivered by: Annamary Rummage CNM Date of Discharge: 07/14/2021  Primary OB: [facility]  [id number] last menstrual period was 10/03/2020 (approximate). EDC Estimated Date of Delivery: 07/09/21 Gestational Age at Delivery: [redacted]w[redacted]d   Antepartum complications:  Left simple ovarian cyst 02/19/21: surgically removed d/t acute pelvic pain Obesity in pregnancy - BMI 35 Tobacco use in pregnancy  Anemia in pregnancy  Admitting Diagnosis: IOL for postdates Secondary Diagnosis: Patient Active Problem List   Diagnosis Date Noted   Encounter for induction of labor 07/11/2021   Preterm uterine contractions in third trimester, antepartum 07/10/2021   Abdominal pain 05/20/2021   Pregnancy 04/05/2021   Decreased fetal movement affecting management of pregnancy in second trimester 03/11/2021   Abdominal pain in pregnancy, second trimester 02/19/2021   Encounter for supervision of normal first pregnancy in second trimester 12/04/2020   Encounter for supervision of normal first pregnancy in third trimester 12/04/2020    Augmentation: Pitocin and Cytotec Complications: None  Intrapartum complications/course:  Delivery Type: spontaneous vaginal delivery Anesthesia: epidural Placenta: spontaneous Laceration: vaginal side wall Episiotomy: none Newborn Data: Live born female  Birth Weight:  8lb 5.3oz APGAR: 8, 9  Newborn Delivery   Birth date/time: 07/12/2021 02:26:00 Delivery type: Vaginal, Spontaneous      21 y.o. G1P0 at [redacted]w[redacted]d presenting with postdates for IOL, SROM with clear fluid.  She progressed to complete and pushed over an intact perineum and delivered the fetal head, followed promptly by the shoulders. She was in control the whole time, and the baby placed on the maternal abdomen. Delayed cord clamping and the FOB cut the  baby's cord, while the baby was skin to skin. The placenta delivered spontaneously and intact. Vaginal wall lac noted and repaired in standard fashion. Mom and baby tolerated the procedure well.   Postpartum Procedures: IV Venofir, IV caffeine for spinal headache  Post partum course:   Patient had an uncomplicated postpartum course.  By time of discharge on PPD#2, her pain was controlled on oral pain medications; she had appropriate lochia and was ambulating, voiding without difficulty and tolerating regular diet.  She was deemed stable for discharge to home.    Discharge Physical Exam:  BP 120/63 (BP Location: Left Arm)   Pulse 79   Temp 98.2 F (36.8 C) (Oral)   Resp 16   Ht 5\' 6"  (1.676 m)   Wt 113 kg   LMP 10/03/2020 (Approximate)   SpO2 100%   Breastfeeding Unknown   BMI 40.21 kg/m   General: alert and no distress Pulm: normal respiratory effort Lochia: appropriate Abdomen: soft, NT Uterine Fundus: firm, below umbilicus Perineum: hemostatic and approximated after repair Extremities: No evidence of DVT seen on physical exam. No lower extremity edema. Edinburgh:     07/12/2021    7:28 AM  Inocente Salles Postnatal Depression Scale Screening Tool  I have been able to laugh and see the funny side of things. 0  I have looked forward with enjoyment to things. 0  I have blamed myself unnecessarily when things went wrong. 1  I have been anxious or worried for no good reason. 2  I have felt scared or panicky for no good reason. 0  Things have been getting on top of me. 0  I have been so unhappy that I have had difficulty sleeping. 0  I have felt sad or miserable. 0  I have  been so unhappy that I have been crying. 0  The thought of harming myself has occurred to me. 0  Edinburgh Postnatal Depression Scale Total 3     Labs:    Latest Ref Rng & Units 07/13/2021    7:35 AM 07/12/2021    6:39 AM 07/11/2021   11:35 AM  CBC  WBC 4.0 - 10.5 K/uL 7.8  11.3  8.1   Hemoglobin 12.0 - 15.0  g/dL 8.2  8.7  29.5   Hematocrit 36.0 - 46.0 % 24.3  25.7  34.6   Platelets 150 - 400 K/uL 233  221  247    A POS Hemoglobin  Date Value Ref Range Status  07/13/2021 8.2 (L) 12.0 - 15.0 g/dL Final   HCT  Date Value Ref Range Status  07/13/2021 24.3 (L) 36.0 - 46.0 % Final    Disposition: stable, discharge to home Baby Feeding: breastmilk Baby Disposition: home with mom  Contraception: considering IUD - Liletta  Prenatal Labs:  Blood type/Rh A pos  Antibody screen neg  Rubella Immune  Varicella Immune  RPR NR  HBsAg Neg  HIV NR  GC neg  Chlamydia neg  Genetic screening declined  1 hour GTT 116  3 hour GTT    GBS Neg   Rh Immune globulin given: n/a Rubella vaccine given: Immune Varicella vaccine given: Immune Tdap vaccine given in AP or PP setting: Needed Flu vaccine given in AP or PP setting: not currently in season  Plan: Tammie Meyer was discharged to home in good condition.  Discharge Instructions: Per After Visit Summary. Activity: Advance as tolerated. Pelvic rest for 6 weeks.   Diet: Regular Discharge Medications: Allergies as of 07/14/2021   No Known Allergies      Medication List     TAKE these medications    acetaminophen 500 MG tablet Commonly known as: TYLENOL Take 2 tablets (1,000 mg total) by mouth every 6 (six) hours as needed (for pain scale < 4  OR  temperature  >/=  100.5 F).   benzocaine-Menthol 20-0.5 % Aero Commonly known as: DERMOPLAST Apply 1 Application topically as needed for irritation (perineal discomfort).   butalbital-acetaminophen-caffeine 50-325-40 MG tablet Commonly known as: FIORICET Take 1 tablet by mouth every 4 (four) hours as needed for headache.   escitalopram 5 MG tablet Commonly known as: LEXAPRO Take 1 tablet (5 mg total) by mouth daily.   ferrous sulfate 325 (65 FE) MG tablet Take 1 tablet (325 mg total) by mouth 2 (two) times daily with a meal.   ibuprofen 600 MG tablet Commonly known as:  ADVIL Take 1 tablet (600 mg total) by mouth every 6 (six) hours.   multivitamin-prenatal 27-0.8 MG Tabs tablet Take 1 tablet by mouth daily at 12 noon.   witch hazel-glycerin pad Commonly known as: TUCKS Apply 1 Application topically as needed for hemorrhoids.       Outpatient follow up:   Follow-up Information     Haroldine Laws, CNM Follow up in 1 week(s).   Specialty: Certified Nurse Midwife Why: 1wk mood check Contact information: [address] [place] [address] [phone]         Haroldine Laws, CNM Follow up in 6 week(s).   Specialty: Certified Nurse Midwife Why: 6wk postpartum, desires Liletta IUD Contact information: [address] [place] [address] [phone]                 Signed: Blanchard Kelch 07/14/2021 8:47 AM

## 2021-07-13 LAB — CBC
HCT: 24.3 % — ABNORMAL LOW (ref 36.0–46.0)
Hemoglobin: 8.2 g/dL — ABNORMAL LOW (ref 12.0–15.0)
MCH: 29.4 pg (ref 26.0–34.0)
MCHC: 33.7 g/dL (ref 30.0–36.0)
MCV: 87.1 fL (ref 80.0–100.0)
Platelets: 233 10*3/uL (ref 150–400)
RBC: 2.79 MIL/uL — ABNORMAL LOW (ref 3.87–5.11)
RDW: 12.9 % (ref 11.5–15.5)
WBC: 7.8 10*3/uL (ref 4.0–10.5)
nRBC: 0 % (ref 0.0–0.2)

## 2021-07-13 MED ORDER — LACTATED RINGERS IV BOLUS
1000.0000 mL | Freq: Once | INTRAVENOUS | Status: AC
Start: 2021-07-13 — End: 2021-07-13
  Administered 2021-07-13: 1000 mL via INTRAVENOUS

## 2021-07-13 MED ORDER — IBUPROFEN 600 MG PO TABS
600.0000 mg | ORAL_TABLET | Freq: Four times a day (QID) | ORAL | Status: DC
  Administered 2021-07-13 – 2021-07-14 (×4): 600 mg via ORAL
  Filled 2021-07-13 (×4): qty 1

## 2021-07-13 MED ORDER — BUTALBITAL-APAP-CAFFEINE 50-325-40 MG PO TABS
1.0000 | ORAL_TABLET | ORAL | Status: DC | PRN
Start: 2021-07-13 — End: 2021-07-15
  Administered 2021-07-13 – 2021-07-14 (×5): 1 via ORAL
  Filled 2021-07-13 (×5): qty 1

## 2021-07-13 MED ORDER — CAFFEINE-SODIUM BENZOATE 125-125 MG/ML IJ SOLN
500.0000 mg | Freq: Once | INTRAMUSCULAR | Status: AC
  Administered 2021-07-13: 500 mg via INTRAVENOUS
  Filled 2021-07-13: qty 2

## 2021-07-13 NOTE — Progress Notes (Signed)
Pt called out complaining of 10/10 headache. Motrin and oxy given to patient. Patient went to [facility] to visit baby and stated her head is hurting and feeling nauseas.  She stated it gets worse when she sits up and when she is talking. Pain medicine is not giving her relief.  Dr. Karlton Lemon notified. New orders placed.

## 2021-07-14 MED ORDER — ESCITALOPRAM OXALATE 5 MG PO TABS: 5.0000 mg | ORAL_TABLET | Freq: Every day | ORAL | 5 refills | Status: AC

## 2021-07-14 MED ORDER — FERROUS SULFATE 325 (65 FE) MG PO TABS: 325.0000 mg | ORAL_TABLET | Freq: Two times a day (BID) | ORAL | 3 refills | Status: AC

## 2021-07-14 MED ORDER — ESCITALOPRAM OXALATE 10 MG PO TABS
5.0000 mg | ORAL_TABLET | Freq: Every day | ORAL | Status: DC
  Administered 2021-07-14: 5 mg via ORAL
  Filled 2021-07-14: qty 1

## 2021-07-14 MED ORDER — BENZOCAINE-MENTHOL 20-0.5 % EX AERO: 1.0000 | INHALATION_SPRAY | CUTANEOUS | Status: AC | PRN

## 2021-07-14 MED ORDER — BUTALBITAL-APAP-CAFFEINE 50-325-40 MG PO TABS: 1.0000 | ORAL_TABLET | ORAL | 0 refills | Status: AC | PRN

## 2021-07-14 MED ORDER — WITCH HAZEL-GLYCERIN EX PADS
1.0000 | MEDICATED_PAD | CUTANEOUS | 12 refills | Status: AC | PRN
Start: 2021-07-14 — End: ?

## 2021-07-14 MED ORDER — IBUPROFEN 600 MG PO TABS: 600.0000 mg | ORAL_TABLET | Freq: Four times a day (QID) | ORAL | 0 refills | Status: AC

## 2021-07-14 NOTE — Progress Notes (Signed)
Pt complaining of severe headache. Dr. Suzan Slick notified and recommended pt call [facility] pain clinic tomorrow morning. Pt given information and will call in the morning if headache does not improve.

## 2021-07-15 ENCOUNTER — Ambulatory Visit (HOSPITAL_BASED_OUTPATIENT_CLINIC_OR_DEPARTMENT_OTHER): Payer: Medicaid Other | Admitting: Anesthesiology

## 2021-07-15 ENCOUNTER — Telehealth: Payer: Self-pay | Admitting: Pain Medicine

## 2021-07-15 ENCOUNTER — Other Ambulatory Visit: Payer: Self-pay | Admitting: Anesthesiology

## 2021-07-15 ENCOUNTER — Encounter: Payer: Self-pay | Admitting: Anesthesiology

## 2021-07-15 ENCOUNTER — Ambulatory Visit
Admission: RE | Admit: 2021-07-15 | Discharge: 2021-07-15 | Disposition: A | Discharge status: Home / Self Care | Payer: Medicaid Other | Source: Ambulatory Visit | Attending: Anesthesiology | Admitting: Anesthesiology

## 2021-07-15 VITALS — BP 116/63 | HR 83 | Temp 97.2°F | Resp 18 | Ht 67.0 in | Wt 248.0 lb

## 2021-07-15 DIAGNOSIS — R52 Pain, unspecified: Secondary | ICD-10-CM | POA: Insufficient documentation

## 2021-07-15 DIAGNOSIS — G971 Other reaction to spinal and lumbar puncture: Secondary | ICD-10-CM

## 2021-07-15 MED ORDER — IOHEXOL 180 MG/ML  SOLN
INTRAMUSCULAR | Status: AC
  Filled 2021-07-15: qty 20

## 2021-07-15 MED ORDER — SODIUM CHLORIDE (PF) 0.9 % IJ SOLN
INTRAMUSCULAR | Status: AC
  Filled 2021-07-15: qty 10

## 2021-07-15 MED ORDER — LIDOCAINE HCL (PF) 1 % IJ SOLN
INTRAMUSCULAR | Status: AC
  Filled 2021-07-15: qty 10

## 2021-07-15 NOTE — Progress Notes (Signed)
Safety precautions to be maintained throughout the outpatient stay will include: orient to surroundings, keep bed in low position, maintain call bell within reach at all times, provide assistance with transfer out of bed and ambulation.  

## 2021-07-15 NOTE — Telephone Encounter (Signed)
I have spoken with Dr. Pernell Dupre. Please put Tammie Meyer on the schedule for today at 2:30 for eval and possible blood patch. I have already confirmed with Tammie Meyer. Thank ou.

## 2021-07-16 ENCOUNTER — Telehealth: Payer: Self-pay | Admitting: *Deleted

## 2021-07-17 ENCOUNTER — Encounter: Payer: Self-pay | Admitting: Anesthesiology

## 2021-07-17 NOTE — Progress Notes (Signed)
Subjective:  Patient ID: Tammie Meyer, female    DOB: 2000-12-24  Age: 21 y.o. MRN: [id number]  CC: Headache (Possible spinal HA post SVD on 07/12/2021)   Procedure: L3-L4 epidural blood patch under fluoroscopic guidance  HPI Tammie Meyer presents for new patient evaluation.  She recently delivered and had an epidural for delivery with new onset headache complaint.  The headache that she is experiencing is positional in nature and when she is seated or standing upright retains a description of a throbbing type posterior occipital headache that is severe enough to seek medical attention.  She notes that when she is recumbent and in the supine position the headache resolves completely.  She denies any fevers chills nausea or vomiting.  No neck stiffness or rigidity is noted no back pain is noted.  Her lower extremity strength and function is uninvolved with no deficits reported.  She has been unable to get any significant relief from conservative therapy including Tylenol or anti-inflammatories and traditional therapy such as drinking fluid has been unsuccessful.  She presents today to the pain clinic requesting an epidural blood patch to assist with headache management.  Outpatient Medications Prior to Visit  Medication Sig Dispense Refill   acetaminophen (TYLENOL) 500 MG tablet Take 2 tablets (1,000 mg total) by mouth every 6 (six) hours as needed (for pain scale < 4  OR  temperature  >/=  100.5 F). 30 tablet 0   benzocaine-Menthol (DERMOPLAST) 20-0.5 % AERO Apply 1 Application topically as needed for irritation (perineal discomfort).     butalbital-acetaminophen-caffeine (FIORICET) 50-325-40 MG tablet Take 1 tablet by mouth every 4 (four) hours as needed for headache. 14 tablet 0   escitalopram (LEXAPRO) 5 MG tablet Take 1 tablet (5 mg total) by mouth daily. 30 tablet 5   ferrous sulfate 325 (65 FE) MG tablet Take 1 tablet (325 mg total) by mouth 2 (two) times daily with a meal.  3    ibuprofen (ADVIL) 600 MG tablet Take 1 tablet (600 mg total) by mouth every 6 (six) hours. 30 tablet 0   Prenatal Vit-Fe Fumarate-FA (MULTIVITAMIN-PRENATAL) 27-0.8 MG TABS tablet Take 1 tablet by mouth daily at 12 noon.     witch hazel-glycerin (TUCKS) pad Apply 1 Application topically as needed for hemorrhoids. 40 each 12   No facility-administered medications prior to visit.    Review of Systems CNS: No confusion or sedation positive headache of the throbbing nature prevalent when she is seated or standing and complete resolution while supine root Cardiac: No angina or palpitations GI: No abdominal pain or constipation Constitutional: No nausea vomiting fevers or chills  Objective:  BP 116/63   Pulse 83   Temp (!) 97.2 F (36.2 C) (Temporal)   Resp 18   Ht '5\' 7"'$  (1.702 m)   Wt 248 lb (112.5 kg)   SpO2 99%   BMI 38.84 kg/m    BP Readings from Last 3 Encounters:  07/15/21 116/63  07/14/21 119/72  07/10/21 (!) 104/59     Wt Readings from Last 3 Encounters:  07/15/21 248 lb (112.5 kg) (>99 %, Z= 2.49)*  07/11/21 249 lb 1.9 oz (113 kg) (>99 %, Z= 2.50)*  07/10/21 248 lb (112.5 kg) (>99 %, Z= 2.49)*   * Growth percentiles are based on CDC (Girls, 2-20 Years) data.     Physical Exam Pt is alert and oriented PERRL EOMI HEART IS RRR no murmur or rub LCTA no wheezing or rales MUSCULOSKELETAL reveals good strength  to the upper extremities and lower extremities.  Muscle tone and bulk is good she has 5/5 strength to flexion extension of the feet knees and hips.  Inspection of the low back reveals no evidence of any erythema or edema or tenderness over to noted sites from previous epidural placement.  She has no neck range of motion limitations.  Good atlantooccipital joint motion and no neck stiffness or soreness.  Good range of motion to the upper extremities as well.  Labs  No results found for: "HGBA1C" Lab Results  Component Value Date   CREATININE 0.36 (L) 07/11/2021     -------------------------------------------------------------------------------------------------------------------- Lab Results  Component Value Date   WBC 7.8 07/13/2021   HGB 8.2 (L) 07/13/2021   HCT 24.3 (L) 07/13/2021   PLT 233 07/13/2021   GLUCOSE 124 (H) 07/11/2021   ALT 8 07/11/2021   AST 13 (L) 07/11/2021   NA 135 07/11/2021   K 3.5 07/11/2021   CL 110 07/11/2021   CREATININE 0.36 (L) 07/11/2021   BUN 7 07/11/2021   CO2 18 (L) 07/11/2021    --------------------------------------------------------------------------------------------------------------------- DG PAIN CLINIC C-ARM 1-60 MIN NO REPORT  Result Date: 07/15/2021 Fluoro was used, but no Radiologist interpretation will be provided. Please refer to "NOTES" tab for provider progress note.    Assessment & Plan:   There are no diagnoses linked to this encounter.      ----------------------------------------------------------------------------------------------------------------------  Problem List Items Addressed This Visit   None     ----------------------------------------------------------------------------------------------------------------------  There are no diagnoses linked to this encounter.   ----------------------------------------------------------------------------------------------------------------------  I am having Lakedra Lahoma Crocker maintain her acetaminophen, multivitamin-prenatal, ibuprofen, ferrous sulfate, benzocaine-Menthol, witch hazel-glycerin, butalbital-acetaminophen-caffeine, and escitalopram.   No orders of the defined types were placed in this encounter.  Patient's Medications  New Prescriptions   No medications on file  Previous Medications   ACETAMINOPHEN (TYLENOL) 500 MG TABLET    Take 2 tablets (1,000 mg total) by mouth every 6 (six) hours as needed (for pain scale < 4  OR  temperature  >/=  100.5 F).   BENZOCAINE-MENTHOL (DERMOPLAST) 20-0.5 % AERO    Apply 1  Application topically as needed for irritation (perineal discomfort).   BUTALBITAL-ACETAMINOPHEN-CAFFEINE (FIORICET) 50-325-40 MG TABLET    Take 1 tablet by mouth every 4 (four) hours as needed for headache.   ESCITALOPRAM (LEXAPRO) 5 MG TABLET    Take 1 tablet (5 mg total) by mouth daily.   FERROUS SULFATE 325 (65 FE) MG TABLET    Take 1 tablet (325 mg total) by mouth 2 (two) times daily with a meal.   IBUPROFEN (ADVIL) 600 MG TABLET    Take 1 tablet (600 mg total) by mouth every 6 (six) hours.   PRENATAL VIT-FE FUMARATE-FA (MULTIVITAMIN-PRENATAL) 27-0.8 MG TABS TABLET    Take 1 tablet by mouth daily at 12 noon.   WITCH HAZEL-GLYCERIN (TUCKS) PAD    Apply 1 Application topically as needed for hemorrhoids.  Modified Medications   No medications on file  Discontinued Medications   No medications on file   ----------------------------------------------------------------------------------------------------------------------  Follow-up: No follow-ups on file.  1. Post-dural puncture headache   Based on her current symptoms and clinical findings, she appears to be suffering from posterior puncture headache.  Despite efforts at conservative care the headache persists and she requests further management with epidural blood patch.  We gone over the risks and benefits of the blood patch including failure to gain relief and possible repeat tap.  She has been made  aware of the unlikely potential risk of bleeding, infection, or nerve injury etc. she is also aware that the headache is likely to resolve on its own spontaneously given adequate time.  Following our discussion she desires to proceed with epidural blood patch.  Plan is for discharge to the outpatient setting but she will be spending time caring for her child and we have asked that she remain recumbent for the next 12 hours as best possible and have encouraged fluid and caffeine management.  Procedure:The patient was taken to the fluoroscopy suite  placed in the prone position.  Fluoroscopic guidance was utilized to identify the L3-4 interspace from an AP view approximately 2 cm below the previously placed epidural site.  Betadine prep was performed x3 and strict aseptic technique was utilized.  1% lidocaine was infiltrated subcu and to the fascia x2 cc at the L3-4 interspace.  An 18-gauge Touhy needle was advanced in the usual fashion to a depth approximately 6 cm.  There is negative heme or CSF with loss-of-resistance to saline.  At the same time an assistant nurse drew 20 cc of dark red heme from the left antecubital fossa using strict aseptic technique.  This was then handed to me for slow 5 cc incremental injection through the epidural needle.  The patient did experience some mild pressure centralized to the low back after 10 cc of injection.  Slow incremental injection was tolerated to a total dose of 18 cc.  At that point the procedure was terminated the needle was withdrawn and the patient was convalesced and discharged home in stable condition for follow-up on a as needed basis.  She is instructed to contact the pain control center should she have any questions or concerns regarding the procedure or her care.   Molli Barrows, MD

## 2022-05-27 ENCOUNTER — Emergency Department: Payer: No Typology Code available for payment source

## 2022-05-27 ENCOUNTER — Other Ambulatory Visit: Payer: Self-pay

## 2022-05-27 ENCOUNTER — Emergency Department
Admission: EM | Admit: 2022-05-27 | Discharge: 2022-05-27 | Disposition: A | Discharge status: Home / Self Care | Payer: No Typology Code available for payment source | Attending: Emergency Medicine | Admitting: Emergency Medicine

## 2022-05-27 DIAGNOSIS — Y92410 Unspecified street and highway as the place of occurrence of the external cause: Secondary | ICD-10-CM | POA: Diagnosis not present

## 2022-05-27 DIAGNOSIS — M542 Cervicalgia: Secondary | ICD-10-CM | POA: Insufficient documentation
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
## 2022-06-01 ENCOUNTER — Other Ambulatory Visit: Payer: Self-pay

## 2022-06-01 ENCOUNTER — Emergency Department
Admission: EM | Admit: 2022-06-01 | Discharge: 2022-06-01 | Discharge status: Against Medical Advice | Payer: Medicaid Other | Attending: Emergency Medicine | Admitting: Emergency Medicine

## 2022-06-01 DIAGNOSIS — Z5321 Procedure and treatment not carried out due to patient leaving prior to being seen by health care provider: Secondary | ICD-10-CM | POA: Diagnosis not present

## 2022-06-01 DIAGNOSIS — L02416 Cutaneous abscess of left lower limb: Secondary | ICD-10-CM | POA: Insufficient documentation

## 2022-06-01 NOTE — ED Triage Notes (Addendum)
Pt to ED via POV c/o abscess on inside of left thigh. Pt states it started developing a few days ago but has now gotten bigger. Pt reports no drainage from it. Reports no fevers, cp, sob

## 2023-02-15 ENCOUNTER — Emergency Department: Payer: Self-pay

## 2023-02-15 ENCOUNTER — Other Ambulatory Visit: Payer: Self-pay

## 2023-02-15 ENCOUNTER — Encounter: Payer: Self-pay | Admitting: Emergency Medicine

## 2023-02-15 ENCOUNTER — Emergency Department
Admission: EM | Admit: 2023-02-15 | Discharge: 2023-02-15 | Disposition: A | Discharge status: Home / Self Care | Payer: Self-pay | Attending: Emergency Medicine | Admitting: Emergency Medicine

## 2023-02-15 DIAGNOSIS — J45909 Unspecified asthma, uncomplicated: Secondary | ICD-10-CM | POA: Insufficient documentation

## 2023-02-15 DIAGNOSIS — J189 Pneumonia, unspecified organism: Secondary | ICD-10-CM | POA: Diagnosis not present

## 2023-02-15 DIAGNOSIS — R1032 Left lower quadrant pain: Secondary | ICD-10-CM | POA: Diagnosis present

## 2023-02-15 LAB — CBC
HCT: 35.1 % — ABNORMAL LOW (ref 36.0–46.0)
Hemoglobin: 12 g/dL (ref 12.0–15.0)
MCH: 28.3 pg (ref 26.0–34.0)
MCHC: 34.2 g/dL (ref 30.0–36.0)
MCV: 82.8 fL (ref 80.0–100.0)
Platelets: 253 10*3/uL (ref 150–400)
RBC: 4.24 MIL/uL (ref 3.87–5.11)
RDW: 13.6 % (ref 11.5–15.5)
WBC: 10.3 10*3/uL (ref 4.0–10.5)
nRBC: 0 % (ref 0.0–0.2)

## 2023-02-15 LAB — POC URINE PREG, ED: Preg Test, Ur: NEGATIVE

## 2023-02-15 LAB — URINALYSIS, ROUTINE W REFLEX MICROSCOPIC
Bacteria, UA: NONE SEEN
Bilirubin Urine: NEGATIVE
Glucose, UA: NEGATIVE mg/dL
Hgb urine dipstick: NEGATIVE
Ketones, ur: NEGATIVE mg/dL
Leukocytes,Ua: NEGATIVE
Nitrite: NEGATIVE
Protein, ur: 30 mg/dL — AB
Specific Gravity, Urine: 1.026 (ref 1.005–1.030)
pH: 5 (ref 5.0–8.0)

## 2023-02-15 LAB — COMPREHENSIVE METABOLIC PANEL
ALT: 12 U/L (ref 0–44)
AST: 11 U/L — ABNORMAL LOW (ref 15–41)
Albumin: 3.4 g/dL — ABNORMAL LOW (ref 3.5–5.0)
Alkaline Phosphatase: 68 U/L (ref 38–126)
Anion gap: 7 (ref 5–15)
BUN: 11 mg/dL (ref 6–20)
CO2: 22 mmol/L (ref 22–32)
Calcium: 8.5 mg/dL — ABNORMAL LOW (ref 8.9–10.3)
Chloride: 105 mmol/L (ref 98–111)
Creatinine, Ser: 0.53 mg/dL (ref 0.44–1.00)
GFR, Estimated: >60 mL/min (ref >60)
Glucose, Bld: 97 mg/dL (ref 70–99)
Potassium: 3.6 mmol/L (ref 3.5–5.1)
Sodium: 134 mmol/L — ABNORMAL LOW (ref 135–145)
Total Bilirubin: 0.6 mg/dL (ref 0.0–1.2)
Total Protein: 7.2 g/dL (ref 6.5–8.1)

## 2023-02-15 LAB — LIPASE, BLOOD: Lipase: 27 U/L (ref 11–51)

## 2023-02-15 MED ORDER — SODIUM CHLORIDE 0.9 % IV BOLUS
1000.0000 mL | Freq: Once | INTRAVENOUS | Status: AC
  Administered 2023-02-15: 1000 mL via INTRAVENOUS

## 2023-02-15 MED ORDER — IOHEXOL 350 MG/ML SOLN
75.0000 mL | Freq: Once | INTRAVENOUS | Status: AC | PRN
  Administered 2023-02-15: 75 mL via INTRAVENOUS

## 2023-02-15 MED ORDER — LORAZEPAM 2 MG/ML IJ SOLN
1.0000 mg | Freq: Once | INTRAMUSCULAR | Status: AC
  Administered 2023-02-15: 1 mg via INTRAVENOUS
  Filled 2023-02-15: qty 1

## 2023-02-15 MED ORDER — ONDANSETRON HCL 4 MG/2ML IJ SOLN
4.0000 mg | Freq: Once | INTRAMUSCULAR | Status: AC
  Administered 2023-02-15: 4 mg via INTRAVENOUS
  Filled 2023-02-15: qty 2

## 2023-02-15 MED ORDER — AZITHROMYCIN 250 MG PO TABS: ORAL_TABLET | ORAL | 0 refills | Status: AC

## 2023-02-15 MED ORDER — GUAIFENESIN-CODEINE 100-10 MG/5ML PO SOLN: 5.0000 mL | Freq: Four times a day (QID) | ORAL | 0 refills | Status: AC | PRN

## 2023-02-15 MED ORDER — CEPHALEXIN 500 MG PO CAPS
500.0000 mg | ORAL_CAPSULE | Freq: Two times a day (BID) | ORAL | 0 refills | Status: AC
Start: 2023-02-15 — End: ?

## 2023-02-15 MED ORDER — KETOROLAC TROMETHAMINE 30 MG/ML IJ SOLN
15.0000 mg | Freq: Once | INTRAMUSCULAR | Status: AC
  Administered 2023-02-15: 15 mg via INTRAVENOUS
  Filled 2023-02-15: qty 1

## 2023-02-15 MED ORDER — HYDROCOD POLI-CHLORPHE POLI ER 10-8 MG/5ML PO SUER
5.0000 mL | Freq: Once | ORAL | Status: AC
  Administered 2023-02-15: 5 mL via ORAL
  Filled 2023-02-15: qty 5

## 2023-02-15 NOTE — ED Provider Notes (Signed)
[facility] Provider Note    Event Date/Time   First MD Initiated Contact with Patient 02/15/23 [id number]     (approximate)  History   Chief Complaint: Abdominal Pain and Back Pain  HPI  Devani P Mckay is a 23 y.o. female with a past medical history of asthma who presents to the emergency department for left flank pain.  According to the patient since Monday she has been experiencing intermittent but significant pain to her left flank that is worse with movement.  She states over the last 24 hours she has now developed some pain in the lower abdomen as well.  Patient states a history of a prior ovarian cyst requiring a surgery.  Denies any vaginal bleeding or discharge.  Denies any dysuria.  No fever.  Physical Exam   Triage Vital Signs: ED Triage Vitals  Encounter Vitals Group     BP 02/15/23 0742 127/78     Systolic BP Percentile --      Diastolic BP Percentile --      Pulse Rate 02/15/23 0742 (!) 118     Resp 02/15/23 0742 20     Temp 02/15/23 0742 97.7 F (36.5 C)     Temp Source 02/15/23 0742 Oral     SpO2 02/15/23 0742 99 %     Weight 02/15/23 0742 240 lb (108.9 kg)     Height 02/15/23 0742 5\' 7"  (1.702 m)     Head Circumference --      Peak Flow --      Pain Score 02/15/23 0741 10     Pain Loc --      Pain Education --      Exclude from Growth Chart --     Most recent vital signs: Vitals:   02/15/23 0742  BP: 127/78  Pulse: (!) 118  Resp: 20  Temp: 97.7 F (36.5 C)  SpO2: 99%    General: Awake, no distress.  CV:  Good peripheral perfusion.  Regular rate and rhythm  Resp:  Normal effort.  Equal breath sounds bilaterally.  Abd:  No distention.  Soft, mild left lower quadrant and suprapubic tenderness otherwise benign abdomen.  ED Results / Procedures / Treatments   RADIOLOGY  I have reviewed and interpreted CT images.  No obvious large or significant abnormality seen on my evaluation.  No obvious kidney stone or significant  ovarian cyst noted.   MEDICATIONS ORDERED IN ED: Medications  sodium chloride 0.9 % bolus 1,000 mL (1,000 mLs Intravenous New Bag/Given 02/15/23 0818)  ketorolac (TORADOL) 30 MG/ML injection 15 mg (15 mg Intravenous Given 02/15/23 0819)  ondansetron (ZOFRAN) injection 4 mg (4 mg Intravenous Given 02/15/23 0820)     IMPRESSION / MDM / ASSESSMENT AND PLAN / ED COURSE  I reviewed the triage vital signs and the nursing notes.  Patient's presentation is most consistent with acute presentation with potential threat to life or bodily function.  Patient presents to the emergency department for left flank pain mostly in the left mid back but also states that she has now developed some discomfort to the lower abdomen as well.  No fever, no vomiting or diarrhea.  No urinary symptoms.  No discharge or vaginal bleeding.  Given the patient's left flank pain we will obtain labs, urinalysis and a CT scan of the abdomen/pelvis.  Patient agreeable to plan of care.  Patient's workup is reassuring, normal CBC, reassuring chemistry including normal LFTs, normal lipase.  Urinalysis is normal as well,  pregnancy test is negative.  CT scan shows no urinary calculi or hydronephrosis.  There is a small left pleural effusion with atelectasis or consolidation.  Trace fluid in the pelvis with right ovarian cysts.  We will obtain a pelvic ultrasound to further evaluate.  We obtain a CTA of the chest given the pleural effusion with associated atelectasis versus consolidation in an otherwise healthy patient.  On further discussion patient states the pain worsens when she takes a deep inspiration.  Patient's pelvic ultrasound shows a 3.4 cm right ovarian cyst with benign characteristics. Patient CTA is negative for PE.  There is a small left lung lobe pneumonia with a left pleural effusion.  We will treat with antibiotics and have the patient repeat a chest x-ray in 4 to 6 weeks to ensure resolution.  FINAL CLINICAL IMPRESSION(S)  / ED DIAGNOSES   Left flank pain Community-acquired pneumonia   Note:  This document was prepared using Dragon voice recognition software and may include unintentional dictation errors.   Minna Antis, MD 02/15/23 1106

## 2023-02-15 NOTE — ED Triage Notes (Signed)
Pt arrived via POV with mother reports L flank and back pain since Monday, reports also having lower abdominal pain with nausea. Pt states she has hx of a cyst while pregnant but doesn't know where it was.  Pt reports abdominal pain as a cramp and denies any dysuria.   Pt has been taking tylenol and ibuprofen at home with minimal relief.
